# Patient Record
Sex: Male | Born: 1950 | Race: White | Hispanic: No | Smoking: Never smoker
Health system: Southern US, Community
[De-identification: ages and names within clinical notes are randomized; demographics above are authoritative.]

## PROBLEM LIST (undated history)

## (undated) DIAGNOSIS — K5790 Diverticulosis of intestine, part unspecified, without perforation or abscess without bleeding: Secondary | ICD-10-CM

## (undated) DIAGNOSIS — E119 Type 2 diabetes mellitus without complications: Secondary | ICD-10-CM

## (undated) HISTORY — DX: Diverticulosis of intestine, part unspecified, without perforation or abscess without bleeding: K57.90

## (undated) HISTORY — DX: Type 2 diabetes mellitus without complications: E11.9

## (undated) HISTORY — PX: WISDOM TOOTH EXTRACTION: SHX21

---

## 2007-02-23 HISTORY — PX: OTHER SURGICAL HISTORY: SHX169

## 2007-07-21 ENCOUNTER — Encounter: Admission: RE | Admit: 2007-07-21 | Discharge: 2007-07-21 | Payer: Self-pay | Admitting: Family Medicine

## 2008-08-12 ENCOUNTER — Ambulatory Visit: Payer: Self-pay | Admitting: Internal Medicine

## 2008-08-15 ENCOUNTER — Encounter: Admission: RE | Admit: 2008-08-15 | Discharge: 2008-08-15 | Payer: Self-pay | Admitting: Internal Medicine

## 2008-08-20 ENCOUNTER — Encounter: Admission: RE | Admit: 2008-08-20 | Discharge: 2008-08-20 | Payer: Self-pay | Admitting: Internal Medicine

## 2008-09-09 ENCOUNTER — Ambulatory Visit: Payer: Self-pay | Admitting: Internal Medicine

## 2008-12-10 ENCOUNTER — Ambulatory Visit: Payer: Self-pay | Admitting: Internal Medicine

## 2009-01-06 ENCOUNTER — Ambulatory Visit: Payer: Self-pay | Admitting: Internal Medicine

## 2009-08-19 ENCOUNTER — Ambulatory Visit: Payer: Self-pay | Admitting: Internal Medicine

## 2010-01-05 ENCOUNTER — Ambulatory Visit: Payer: Self-pay | Admitting: Internal Medicine

## 2010-03-15 ENCOUNTER — Encounter: Payer: Self-pay | Admitting: Family Medicine

## 2010-08-20 ENCOUNTER — Other Ambulatory Visit: Payer: BC Managed Care – PPO | Admitting: Internal Medicine

## 2010-08-20 DIAGNOSIS — E119 Type 2 diabetes mellitus without complications: Secondary | ICD-10-CM

## 2010-08-20 DIAGNOSIS — Z0000 Encounter for general adult medical examination without abnormal findings: Secondary | ICD-10-CM

## 2010-08-20 LAB — COMPREHENSIVE METABOLIC PANEL
AST: 27 U/L (ref 0–37)
Albumin: 4.6 g/dL (ref 3.5–5.2)
Alkaline Phosphatase: 64 U/L (ref 39–117)
BUN: 19 mg/dL (ref 6–23)
Creat: 0.94 mg/dL (ref 0.50–1.35)
Glucose, Bld: 139 mg/dL — ABNORMAL HIGH (ref 70–99)
Potassium: 4.5 mEq/L (ref 3.5–5.3)
Total Bilirubin: 0.6 mg/dL (ref 0.3–1.2)

## 2010-08-20 LAB — LIPID PANEL
Cholesterol: 179 mg/dL (ref 0–200)
HDL: 35 mg/dL — ABNORMAL LOW (ref >39)
LDL Cholesterol: 109 mg/dL — ABNORMAL HIGH (ref 0–99)
Total CHOL/HDL Ratio: 5.1 Ratio
Triglycerides: 175 mg/dL — ABNORMAL HIGH (ref <150)
VLDL: 35 mg/dL (ref 0–40)

## 2010-08-21 ENCOUNTER — Encounter: Payer: Self-pay | Admitting: Internal Medicine

## 2010-08-21 ENCOUNTER — Ambulatory Visit (INDEPENDENT_AMBULATORY_CARE_PROVIDER_SITE_OTHER): Payer: BC Managed Care – PPO | Admitting: Internal Medicine

## 2010-08-21 VITALS — BP 136/76 | HR 68 | Temp 98.8°F | Ht 71.0 in | Wt 235.0 lb

## 2010-08-21 DIAGNOSIS — Z23 Encounter for immunization: Secondary | ICD-10-CM

## 2010-08-21 DIAGNOSIS — E781 Pure hyperglyceridemia: Secondary | ICD-10-CM

## 2010-08-21 DIAGNOSIS — E669 Obesity, unspecified: Secondary | ICD-10-CM

## 2010-08-21 DIAGNOSIS — E119 Type 2 diabetes mellitus without complications: Secondary | ICD-10-CM

## 2010-08-21 DIAGNOSIS — Z0000 Encounter for general adult medical examination without abnormal findings: Secondary | ICD-10-CM

## 2010-08-21 LAB — CBC WITH DIFFERENTIAL/PLATELET
Hemoglobin: 15.3 g/dL (ref 13.0–17.0)
Lymphs Abs: 2.7 10*3/uL (ref 0.7–4.0)
Monocytes Relative: 9 % (ref 3–12)
Neutro Abs: 4 10*3/uL (ref 1.7–7.7)
Neutrophils Relative %: 53 % (ref 43–77)
RBC: 5.27 MIL/uL (ref 4.22–5.81)
WBC: 7.7 10*3/uL (ref 4.0–10.5)

## 2010-08-21 LAB — POCT URINALYSIS DIPSTICK
Bilirubin, UA: NEGATIVE
Ketones, UA: NEGATIVE
Leukocytes, UA: NEGATIVE
Nitrite, UA: NEGATIVE
Protein, UA: NEGATIVE

## 2010-08-21 LAB — PSA, TOTAL AND FREE: PSA, Free Pct: 50 % (ref >25)

## 2010-08-21 LAB — HEMOGLOBIN A1C: Mean Plasma Glucose: 143 mg/dL — ABNORMAL HIGH (ref <117)

## 2010-08-21 MED ORDER — ZOSTER VACCINE LIVE 19400 UNT/0.65ML ~~LOC~~ SOLR
0.6500 mL | Freq: Once | SUBCUTANEOUS | Status: AC
  Administered 2010-08-21: 19400 [IU] via SUBCUTANEOUS

## 2010-08-22 ENCOUNTER — Encounter: Payer: Self-pay | Admitting: Internal Medicine

## 2010-08-22 DIAGNOSIS — E669 Obesity, unspecified: Secondary | ICD-10-CM | POA: Insufficient documentation

## 2010-08-22 DIAGNOSIS — E119 Type 2 diabetes mellitus without complications: Secondary | ICD-10-CM | POA: Insufficient documentation

## 2010-08-22 NOTE — Patient Instructions (Signed)
Recommend diet exercise and weight loss 10-15 pounds minimum in the next 6 months. Please take Lipitor as prescribed at supper for elevated triglycerides with history of diabetes and family history of heart disease. Please start taking a baby aspirin 81 mg daily in view of family history of heart disease and diabetes. Please take metformin 500 mg twice daily not once daily. Would suggest that you check Accu-Cheks before meals on occasion to monitor your blood sugar. Ideal blood sugar would be a proximally 110.

## 2010-08-22 NOTE — Progress Notes (Signed)
Subjective:    Patient ID: Steven Wheeler, male    DOB: 1950/11/23, 60 y.o.   MRN: [id number]  HPI  patient in today for health maintenance exam and evaluation of medical problems including adult onset diabetes and hyperlipidemia. He's been a patient here since 2010. No known drug allergies. History of fractured left arm at age 89, history of hepatitis A approximately 51, wisdom teeth removed approximately age 32. Had virtual colonoscopy May 2009. Annual eye exam by Dr. Sherryle Lis each fall- last in fall 2011. Controls his diabetes with diet exercise and metformin. However is only been taking metformin 500 mg once daily instead of twice daily as I originally prescribed. Initial weight when we first started seeing him was 234 pounds. At one point he lost 10 pounds in the summer of 2010. he had but has since regained it. Last hemoglobin A1c in November 2011 6.4% in June 2011 triglycerides elevated at 194 but normal cholesterol and LDL cholesterol. In June 2011 hemoglobin A1c 6.3% in November 2010, hemoglobin A1c 6.5%. Best A1c ever was 6.2% in July 2010 which coincided with his weight loss of 10 pounds. Nonsmoker. Social alcohol consumption only. Married. Wife is a Runner, broadcasting/film/video. 2 adult daughters. He is a Medical illustrator.    Review of Systems  Constitutional: Negative.   HENT: Negative.   Eyes: Negative.   Respiratory: Negative.   Cardiovascular: Negative.   Gastrointestinal: Negative.   Genitourinary: Negative for frequency, hematuria, decreased urine volume, discharge, penile swelling, scrotal swelling, penile pain and testicular pain.  Musculoskeletal: Negative.   Neurological: Negative.   Hematological: Negative.   Psychiatric/Behavioral: Negative.        Objective:   Physical Exam  Constitutional: He is oriented to person, place, and time. He appears well-nourished. No distress.  HENT:  Head: Normocephalic and atraumatic.  Nose: Nose normal.  Mouth/Throat: Oropharynx is clear and moist. No  oropharyngeal exudate.  Eyes: Conjunctivae and EOM are normal. Pupils are equal, round, and reactive to light. Right eye exhibits no discharge. Left eye exhibits no discharge. No scleral icterus.  Neck: Normal range of motion. Neck supple. No JVD present. No thyromegaly present.  Cardiovascular: Normal rate, regular rhythm and normal heart sounds.   No murmur heard. Pulmonary/Chest: Effort normal and breath sounds normal. No respiratory distress. He has no wheezes. He has no rales. He exhibits no tenderness.  Abdominal: Soft. Bowel sounds are normal. He exhibits no distension and no mass. There is no tenderness. There is no rebound.  Genitourinary: Prostate normal and penis normal.  Musculoskeletal: Normal range of motion. He exhibits no edema.  Lymphadenopathy:    He has no cervical adenopathy.  Neurological: He is alert and oriented to person, place, and time. He has normal reflexes. No cranial nerve deficit. Coordination normal.  Skin: Skin is warm and dry. No rash noted. He is not diaphoretic.  Psychiatric: He has a normal mood and affect. His behavior is normal. Judgment normal.          Assessment & Plan:  1-adult onset diabetes mellitus controlled with metformin diet and exercise. Patient has only been taking metformin once daily. Increase to twice daily as previously prescribed and recheck in 6 months.  2-hypertriglyceridemia. Explained to patient that [facility] had wanted him to be on a statin medication. He is a bit reluctant to do that but I think with history of diabetes in father who died with heart attack it would be good to put him on low dose Lipitor 10  mg daily and recheck lipid panel and liver functions in 6 months. He is agreeable to this.  3-health maintenance--- would suggest he start a baby aspirin 81 mg daily in view of family history diabetes and hypertriglyceridemia. Zostavax vaccine administered today. Return in 6 months. Patient not checking Accu-Cheks  on a regular basis. Would advise that he try to do this diet exercise and lose 10-15 pounds minimal

## 2010-10-20 ENCOUNTER — Ambulatory Visit (INDEPENDENT_AMBULATORY_CARE_PROVIDER_SITE_OTHER): Payer: BC Managed Care – PPO | Admitting: Internal Medicine

## 2010-10-20 ENCOUNTER — Encounter: Payer: Self-pay | Admitting: Internal Medicine

## 2010-10-20 VITALS — BP 140/82 | HR 72 | Temp 97.3°F | Ht 71.0 in | Wt 232.0 lb

## 2010-10-20 DIAGNOSIS — L259 Unspecified contact dermatitis, unspecified cause: Secondary | ICD-10-CM

## 2010-10-21 ENCOUNTER — Encounter: Payer: Self-pay | Admitting: Internal Medicine

## 2010-10-21 NOTE — Progress Notes (Signed)
  Subjective:    Patient ID: Steven Wheeler, male    DOB: 1950/07/21, 60 y.o.   MRN: [id number]  HPI  patient and his brother were burning some brush on August 25 and August 26. Patient wore goggles and a long sleeve shirt but still apparently came in contact with poison ivy and has periorbital swelling and itching.    Review of Systems     Objective:   Physical Exam erythema and periorbital swelling both eyes. No evidence of conjunctivitis.        Assessment & Plan:  Contact dermatitis which will be treated with  Sterapred DS 10 mg 6 day dosepak take as directed in a tapering course

## 2010-11-11 ENCOUNTER — Other Ambulatory Visit: Payer: Self-pay | Admitting: Internal Medicine

## 2010-11-11 ENCOUNTER — Other Ambulatory Visit: Payer: Self-pay

## 2010-11-11 MED ORDER — METFORMIN HCL 500 MG PO TABS: 500.0000 mg | ORAL_TABLET | Freq: Two times a day (BID) | ORAL | Status: DC

## 2011-02-08 ENCOUNTER — Other Ambulatory Visit: Payer: BC Managed Care – PPO | Admitting: Internal Medicine

## 2011-02-08 DIAGNOSIS — E785 Hyperlipidemia, unspecified: Secondary | ICD-10-CM

## 2011-02-08 LAB — LIPID PANEL
Cholesterol: 143 mg/dL (ref 0–200)
Total CHOL/HDL Ratio: 4.2 Ratio

## 2011-02-09 ENCOUNTER — Encounter: Payer: Self-pay | Admitting: Internal Medicine

## 2011-02-09 ENCOUNTER — Ambulatory Visit (INDEPENDENT_AMBULATORY_CARE_PROVIDER_SITE_OTHER): Payer: BC Managed Care – PPO | Admitting: Internal Medicine

## 2011-02-09 DIAGNOSIS — I10 Essential (primary) hypertension: Secondary | ICD-10-CM

## 2011-02-09 DIAGNOSIS — E119 Type 2 diabetes mellitus without complications: Secondary | ICD-10-CM

## 2011-02-09 DIAGNOSIS — E781 Pure hyperglyceridemia: Secondary | ICD-10-CM

## 2011-02-09 LAB — HEMOGLOBIN A1C: Mean Plasma Glucose: 157 mg/dL — ABNORMAL HIGH (ref <117)

## 2011-02-22 ENCOUNTER — Encounter: Payer: Self-pay | Admitting: Internal Medicine

## 2011-02-22 NOTE — Progress Notes (Signed)
  Subjective:    Patient ID: Steven Wheeler, male    DOB: [birth year], 60 y.o.   MRN: [id number]  HPI 60 year old white male with history of diabetes mellitus diagnosed in 2010 treated with metformin. He takes baby aspirin 81 mg daily. Was tried on Lipitor for short time but he was not compliant with it. History of hypertriglyceridemia. Patient is married. His wife is a Runner, broadcasting/film/video. 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 History wisdom teeth extraction x4 around age 33, history of hepatitis A in 1982. Fractured left arm at age 89. Had a virtual colonoscopy 2009. Dr. Sherryle Lis does yearly eye exams. Had shingles vaccine June 2006, Pneumovax immunization October 2010, tetanus immunization 2004.    Review of Systems     Objective:   Physical Exam his blood pressure is elevated today and has been elevated the last couple of visits he's had. He doesn't like to acknowledge that he has hypertension. I tried to start him on an ACE inhibitor a while back and he didn't really want to do that. Chest is clear, neck no bruits no JVD; cardiac exam regular rate and rhythm; extremities without edema; diabetic foot exam: no ulcers, pulses are normal        Assessment & Plan:  Adult onset diabetes  Hypertriglyceridemia  Hypertension  Plan: Add ACE inhibitor. Try to get patient to take statin medication once again. He prefers to control lipids by diet. Return in a couple of months for blood pressure followup.

## 2011-02-22 NOTE — Patient Instructions (Signed)
Please began to take ACE inhibitor as directed daily. Consider restarting statin medication Cardura diabetic and you have hypertriglyceridemia. Return in 2 months. Continue metformin.

## 2011-04-16 ENCOUNTER — Other Ambulatory Visit: Payer: Self-pay | Admitting: Internal Medicine

## 2011-06-24 ENCOUNTER — Ambulatory Visit (INDEPENDENT_AMBULATORY_CARE_PROVIDER_SITE_OTHER): Payer: BC Managed Care – PPO | Admitting: Internal Medicine

## 2011-06-24 ENCOUNTER — Encounter: Payer: Self-pay | Admitting: Internal Medicine

## 2011-06-24 VITALS — BP 144/80 | HR 80 | Temp 98.6°F | Wt 244.0 lb

## 2011-06-24 DIAGNOSIS — J329 Chronic sinusitis, unspecified: Secondary | ICD-10-CM

## 2011-06-24 DIAGNOSIS — J309 Allergic rhinitis, unspecified: Secondary | ICD-10-CM

## 2011-06-24 NOTE — Progress Notes (Signed)
  Subjective:    Patient ID: Steven Wheeler, male    DOB: 11-07-1950, 61 y.o.   MRN: [id number]  HPI Patient has been ill with respiratory infection for several weeks. Wife has had similar illness and was recently seen here  with bronchitis. Patient says he has discolored nasal drainage, no sore throat, no fever or chills. Says he has nasal congestion and malaise.    Review of Systems     Objective:   Physical Exam   TMs are slightly full but not red; neck supple without significant adenopathy; pharynx only very slightly injected without exudate. Boggy nasal mucosa. Chest clear to auscultation.           Sinusitis  Possible allergic rhinitis  Plan: Augmentin 500 mg 3 times daily for 10 days. Medrol Dosepak 4 mg 6 day

## 2011-06-24 NOTE — Patient Instructions (Addendum)
Take antibiotics and steroids as prescribed. Call if not better in 2 weeks.

## 2011-08-23 ENCOUNTER — Other Ambulatory Visit: Payer: BC Managed Care – PPO | Admitting: Internal Medicine

## 2011-08-23 DIAGNOSIS — E119 Type 2 diabetes mellitus without complications: Secondary | ICD-10-CM

## 2011-08-23 DIAGNOSIS — Z125 Encounter for screening for malignant neoplasm of prostate: Secondary | ICD-10-CM

## 2011-08-23 DIAGNOSIS — I10 Essential (primary) hypertension: Secondary | ICD-10-CM

## 2011-08-23 DIAGNOSIS — E785 Hyperlipidemia, unspecified: Secondary | ICD-10-CM

## 2011-08-23 LAB — CBC WITH DIFFERENTIAL/PLATELET
HCT: 43.7 % (ref 39.0–52.0)
Hemoglobin: 15.4 g/dL (ref 13.0–17.0)
Lymphs Abs: 2.5 10*3/uL (ref 0.7–4.0)
MCH: 29.8 pg (ref 26.0–34.0)
Monocytes Absolute: 0.8 10*3/uL (ref 0.1–1.0)
Monocytes Relative: 10 % (ref 3–12)
Neutro Abs: 3.9 10*3/uL (ref 1.7–7.7)
Neutrophils Relative %: 52 % (ref 43–77)
RBC: 5.17 MIL/uL (ref 4.22–5.81)

## 2011-08-23 LAB — COMPREHENSIVE METABOLIC PANEL
Albumin: 4.6 g/dL (ref 3.5–5.2)
CO2: 25 mEq/L (ref 19–32)
Calcium: 9.7 mg/dL (ref 8.4–10.5)
Chloride: 102 mEq/L (ref 96–112)
Glucose, Bld: 179 mg/dL — ABNORMAL HIGH (ref 70–99)
Potassium: 4.7 mEq/L (ref 3.5–5.3)
Sodium: 138 mEq/L (ref 135–145)
Total Protein: 7 g/dL (ref 6.0–8.3)

## 2011-08-23 LAB — HEMOGLOBIN A1C: Mean Plasma Glucose: 160 mg/dL — ABNORMAL HIGH (ref <117)

## 2011-08-23 LAB — LIPID PANEL
Cholesterol: 177 mg/dL (ref 0–200)
LDL Cholesterol: 117 mg/dL — ABNORMAL HIGH (ref 0–99)
Triglycerides: 126 mg/dL (ref <150)

## 2011-08-24 ENCOUNTER — Ambulatory Visit (INDEPENDENT_AMBULATORY_CARE_PROVIDER_SITE_OTHER): Payer: BC Managed Care – PPO | Admitting: Internal Medicine

## 2011-08-24 ENCOUNTER — Encounter: Payer: Self-pay | Admitting: Internal Medicine

## 2011-08-24 VITALS — BP 140/92 | HR 68 | Temp 97.8°F | Ht 71.0 in | Wt 239.0 lb

## 2011-08-24 DIAGNOSIS — E119 Type 2 diabetes mellitus without complications: Secondary | ICD-10-CM

## 2011-08-24 DIAGNOSIS — Z0000 Encounter for general adult medical examination without abnormal findings: Secondary | ICD-10-CM

## 2011-08-24 DIAGNOSIS — E785 Hyperlipidemia, unspecified: Secondary | ICD-10-CM

## 2011-08-24 DIAGNOSIS — N529 Male erectile dysfunction, unspecified: Secondary | ICD-10-CM

## 2011-08-24 LAB — POCT URINALYSIS DIPSTICK
Bilirubin, UA: NEGATIVE
Glucose, UA: NEGATIVE
Leukocytes, UA: NEGATIVE
Nitrite, UA: NEGATIVE
pH, UA: 6

## 2011-09-18 DIAGNOSIS — N529 Male erectile dysfunction, unspecified: Secondary | ICD-10-CM | POA: Insufficient documentation

## 2011-09-18 NOTE — Patient Instructions (Addendum)
Try the diet exercise and lose some weight. Watch Accu-Cheks on a more regular basis. Return in 6 months for office visit lipid panel and hemoglobin A1c

## 2011-09-18 NOTE — Progress Notes (Signed)
  Subjective:    Patient ID: Steven Wheeler, male    DOB: [birth year], 61 y.o.   MRN: [id number]  HPI 61 year old white male with history of adult onset diabetes and hyperlipidemia. No known drug allergies.  Past medical history: Fractured left arm at age 48, history of Hepatitis A approximately 10, wisdom teeth removed at approximately age 82. Had virtual colonoscopy may 2009. Has annual eye exam by Dr. Sherryle Lis each fall of the year.  Controls his diabetes with diet and exercise and metformin. Nonsmoker. Social alcohol consumption only.  Social history: He is married wife is a Runner, broadcasting/film/video. 2 adult daughters. He is a Medical illustrator.    Review of Systems  Constitutional: Negative.   HENT: Negative.   Eyes: Negative.   Respiratory: Negative.   Cardiovascular: Negative.   Gastrointestinal: Negative.   Genitourinary: Negative.   Musculoskeletal: Negative.   Neurological: Negative.   Hematological: Negative.   Psychiatric/Behavioral: Negative.        Objective:   Physical Exam  Vitals reviewed. Constitutional: He is oriented to person, place, and time. He appears well-developed and well-nourished. No distress.  HENT:  Head: Normocephalic and atraumatic.  Right Ear: External ear normal.  Left Ear: External ear normal.  Mouth/Throat: Oropharynx is clear and moist. No oropharyngeal exudate.  Eyes: Conjunctivae and EOM are normal. Pupils are equal, round, and reactive to light. Right eye exhibits no discharge. No scleral icterus.  Neck: Neck supple. No JVD present. No thyromegaly present.  Cardiovascular: Normal rate, regular rhythm, normal heart sounds and intact distal pulses.   No murmur heard. Pulmonary/Chest: Effort normal and breath sounds normal. He has no wheezes. He has no rales.  Abdominal: Soft. Bowel sounds are normal. He exhibits no distension and no mass. There is no tenderness. There is no rebound and no guarding.  Genitourinary: Prostate normal.  Musculoskeletal: Normal range  of motion. He exhibits no edema and no tenderness.  Lymphadenopathy:    He has no cervical adenopathy.  Neurological: He is alert and oriented to person, place, and time. He has normal reflexes. No cranial nerve deficit. Coordination normal.  Skin: Skin is warm and dry. He is not diaphoretic.  Psychiatric: He has a normal mood and affect. His behavior is normal. Thought content normal.          Assessment & Plan:  Type 2 diabetes mellitus control with metformin  Erectile dysfunction  Hyperlipidemia  Remote history of hepatitis A  Plan: Return in 6 months for office visit, lipid panel and hemoglobin A1c. Encouraged diet and exercise. He does not want to be a lipid-lowering medication despite being a diabetic. Hemoglobin A1c 7.2%. Reevaluate in 6 months. Needs to lose some weight. Renew Viagra for one year.

## 2011-12-22 ENCOUNTER — Other Ambulatory Visit: Payer: Self-pay | Admitting: Internal Medicine

## 2012-02-28 ENCOUNTER — Other Ambulatory Visit: Payer: BC Managed Care – PPO | Admitting: Internal Medicine

## 2012-02-28 DIAGNOSIS — E119 Type 2 diabetes mellitus without complications: Secondary | ICD-10-CM

## 2012-02-28 DIAGNOSIS — E785 Hyperlipidemia, unspecified: Secondary | ICD-10-CM

## 2012-02-28 LAB — LIPID PANEL
LDL Cholesterol: 102 mg/dL — ABNORMAL HIGH (ref 0–99)
Triglycerides: 184 mg/dL — ABNORMAL HIGH (ref <150)

## 2012-02-29 ENCOUNTER — Ambulatory Visit (INDEPENDENT_AMBULATORY_CARE_PROVIDER_SITE_OTHER): Payer: BC Managed Care – PPO | Admitting: Internal Medicine

## 2012-02-29 ENCOUNTER — Encounter: Payer: Self-pay | Admitting: Internal Medicine

## 2012-02-29 VITALS — BP 138/76 | HR 76 | Temp 98.2°F | Wt 250.0 lb

## 2012-02-29 DIAGNOSIS — E119 Type 2 diabetes mellitus without complications: Secondary | ICD-10-CM

## 2012-02-29 DIAGNOSIS — E785 Hyperlipidemia, unspecified: Secondary | ICD-10-CM

## 2012-02-29 DIAGNOSIS — E669 Obesity, unspecified: Secondary | ICD-10-CM

## 2012-04-30 ENCOUNTER — Encounter: Payer: Self-pay | Admitting: Internal Medicine

## 2012-04-30 NOTE — Patient Instructions (Addendum)
Continue metformin. Consider statin medication. Return in 6 months. Lose weight and exercise.

## 2012-04-30 NOTE — Progress Notes (Signed)
  Subjective:    Patient ID: Steven Wheeler, male    DOB: 1950/09/09, 62 y.o.   MRN: [id number]  HPI 62 year old white male last seen June 2012 for health maintenance and evaluation of medical problems including adult onset diabetes and hyperlipidemia. He has a history of hypertriglyceridemia, type 2 diabetes mellitus and obesity. Controls his diabetes with diet, exercise, and metformin. Hemoglobin A1c in November 2011 6.4%. History of elevated triglycerides at 194 in June 2011 with normal cholesterol and LDL cholesterol. He is a nonsmoker. Social alcohol consumption. 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 Wife is a Runner, broadcasting/film/video. He plans to retire after the first of the year. She will be retiring as well.  He is here for six-month recheck. Weight is up 15 pounds from June 2012.    Review of Systems     Objective:   Physical Exam skin warm and dry, nodes none. HEENT exam: TMs and pharynx are clear. Neck is supple without thyromegaly JVD or carotid bruits. Chest clear to auscultation. Cardiac exam regular rate and rhythm normal S1 and S2. Extremities without edema. Pulses in feet are normal. Diabetic foot exam: No calluses or alters.        Assessment & Plan:  Type 2 diabetes mellitus-well controlled without complications. Hemoglobin A1c 7.1% and previously was 6.6% June 2012 Hypertriglyceridemia-not improved   Hyper cholesterolemia-LDL cholesterol improved from 117-102  Obesity-increase of 15 pounds since June 2012  Plan: Encouraged diet and exercise. Patient does not want to be on statin medication. Continue metformin for diabetic control. Return in 6 months for physical examination. Says with retirement he'll have time for diet and exercise.  Time spent with patient 25 minutes

## 2012-08-31 ENCOUNTER — Other Ambulatory Visit: Payer: Self-pay | Admitting: Internal Medicine

## 2012-08-31 ENCOUNTER — Other Ambulatory Visit: Payer: BC Managed Care – PPO | Admitting: Internal Medicine

## 2012-08-31 DIAGNOSIS — Z130 Encounter for screening for diseases of the blood and blood-forming organs and certain disorders involving the immune mechanism: Secondary | ICD-10-CM

## 2012-08-31 DIAGNOSIS — I10 Essential (primary) hypertension: Secondary | ICD-10-CM

## 2012-08-31 DIAGNOSIS — E785 Hyperlipidemia, unspecified: Secondary | ICD-10-CM

## 2012-08-31 DIAGNOSIS — E119 Type 2 diabetes mellitus without complications: Secondary | ICD-10-CM

## 2012-08-31 DIAGNOSIS — Z125 Encounter for screening for malignant neoplasm of prostate: Secondary | ICD-10-CM

## 2012-08-31 LAB — CBC WITH DIFFERENTIAL/PLATELET
Basophils Absolute: 0.1 10*3/uL (ref 0.0–0.1)
Basophils Relative: 1 % (ref 0–1)
Eosinophils Absolute: 0.2 10*3/uL (ref 0.0–0.7)
Hemoglobin: 15 g/dL (ref 13.0–17.0)
MCH: 28.8 pg (ref 26.0–34.0)
MCHC: 34 g/dL (ref 30.0–36.0)
Monocytes Absolute: 0.7 10*3/uL (ref 0.1–1.0)
Monocytes Relative: 10 % (ref 3–12)
Neutro Abs: 4.1 10*3/uL (ref 1.7–7.7)
Neutrophils Relative %: 52 % (ref 43–77)
RDW: 13.1 % (ref 11.5–15.5)

## 2012-08-31 LAB — COMPREHENSIVE METABOLIC PANEL WITH GFR
ALT: 33 U/L (ref 0–53)
AST: 39 U/L — ABNORMAL HIGH (ref 0–37)
Albumin: 4.1 g/dL (ref 3.5–5.2)
Alkaline Phosphatase: 56 U/L (ref 39–117)
BUN: 22 mg/dL (ref 6–23)
CO2: 24 meq/L (ref 19–32)
Calcium: 9.1 mg/dL (ref 8.4–10.5)
Chloride: 101 meq/L (ref 96–112)
Creat: 1.03 mg/dL (ref 0.50–1.35)
Glucose, Bld: 150 mg/dL — ABNORMAL HIGH (ref 70–99)
Potassium: 4.2 meq/L (ref 3.5–5.3)
Sodium: 135 meq/L (ref 135–145)
Total Bilirubin: 0.7 mg/dL (ref 0.3–1.2)
Total Protein: 6.7 g/dL (ref 6.0–8.3)

## 2012-08-31 LAB — LIPID PANEL
Cholesterol: 145 mg/dL (ref 0–200)
HDL: 37 mg/dL — ABNORMAL LOW
LDL Cholesterol: 85 mg/dL (ref 0–99)
Total CHOL/HDL Ratio: 3.9 ratio
Triglycerides: 117 mg/dL
VLDL: 23 mg/dL (ref 0–40)

## 2012-08-31 LAB — HEMOGLOBIN A1C: Hgb A1c MFr Bld: 6.9 % — ABNORMAL HIGH (ref <5.7)

## 2012-09-01 ENCOUNTER — Encounter: Payer: Self-pay | Admitting: Internal Medicine

## 2012-09-01 ENCOUNTER — Ambulatory Visit (INDEPENDENT_AMBULATORY_CARE_PROVIDER_SITE_OTHER): Payer: BC Managed Care – PPO | Admitting: Internal Medicine

## 2012-09-01 ENCOUNTER — Other Ambulatory Visit: Payer: Self-pay | Admitting: Internal Medicine

## 2012-09-01 VITALS — BP 140/84 | HR 72 | Temp 97.9°F | Ht 72.0 in | Wt 226.0 lb

## 2012-09-01 DIAGNOSIS — E785 Hyperlipidemia, unspecified: Secondary | ICD-10-CM

## 2012-09-01 DIAGNOSIS — Z23 Encounter for immunization: Secondary | ICD-10-CM

## 2012-09-01 DIAGNOSIS — N529 Male erectile dysfunction, unspecified: Secondary | ICD-10-CM

## 2012-09-01 DIAGNOSIS — E119 Type 2 diabetes mellitus without complications: Secondary | ICD-10-CM

## 2012-09-01 DIAGNOSIS — Z0000 Encounter for general adult medical examination without abnormal findings: Secondary | ICD-10-CM

## 2012-09-01 LAB — POCT URINALYSIS DIPSTICK
Bilirubin, UA: NEGATIVE
Blood, UA: NEGATIVE
Ketones, UA: NEGATIVE
Leukocytes, UA: NEGATIVE
Spec Grav, UA: 1.01
pH, UA: 7

## 2012-09-01 MED ORDER — TETANUS-DIPHTH-ACELL PERTUSSIS 5-2.5-18.5 LF-MCG/0.5 IM SUSP: 0.5000 mL | Freq: Once | INTRAMUSCULAR | Status: DC

## 2012-09-01 NOTE — Patient Instructions (Addendum)
Home blood pressure monitor prescribed. Call with results in 2 weeks. Increase metformin to twice daily. Return in 6 months for six-month recheck, office visit lipid panel hemoglobin A1c. Patient does not want to be on statin medication.

## 2012-09-01 NOTE — Progress Notes (Signed)
  Subjective:    Patient ID: Steven Wheeler, male    DOB: 07-04-1950, 62 y.o.   MRN: [id number]  HPI 62 year old White male for health maintenance and evaluation of medical problems. History of DM and hyperlipidemia. History of erectile dysfunction treated with Viagra.  No known drug allergies  Past medical history: History of fractured left arm at age 38, history of hepatitis a around 74, wisdom teeth removed at approximately age 28.   Has annual exam by Dr. Sherryle Lis each Fall  Virtual colonoscopy may 2009.  Controls diabetes with diet exercise and metformin. Has only been taking metformin once daily instead of twice daily. I would like for him to take it twice daily. He has retired and has more time for diet and exercise now.    Social history: Nonsmoker, social alcohol consumption. Married. Wife is a retired Runner, broadcasting/film/video. 2 adult daughters. Formerly worked as a Medical illustrator at [employer]    Review of Systems  Constitutional: Negative.   HENT: Negative.   Eyes: Negative.   Respiratory: Negative.   Cardiovascular: Negative.   Gastrointestinal: Negative.   Genitourinary:       History of erectile dysfunction  Allergic/Immunologic: Negative.   Neurological: Negative.   Hematological: Negative.   Psychiatric/Behavioral: Negative.        Objective:   Physical Exam  Vitals reviewed. Constitutional: He is oriented to person, place, and time. He appears well-developed and well-nourished. No distress.  HENT:  Head: Normocephalic and atraumatic.  Right Ear: External ear normal.  Left Ear: External ear normal.  Mouth/Throat: Oropharynx is clear and moist. No oropharyngeal exudate.  Eyes: Conjunctivae and EOM are normal. Pupils are equal, round, and reactive to light.  Neck: Neck supple. No JVD present. No thyromegaly present.  Cardiovascular: Normal rate, regular rhythm, normal heart sounds and intact distal pulses.   No murmur heard. Pulmonary/Chest: Effort normal and breath  sounds normal. No respiratory distress. He has no wheezes. He has no rales. He exhibits no tenderness.  Abdominal: Soft. Bowel sounds are normal. He exhibits no distension and no mass. There is no tenderness. There is no rebound and no guarding.  Genitourinary: Prostate normal.  Lymphadenopathy:    He has no cervical adenopathy.  Neurological: He is alert and oriented to person, place, and time. He has normal reflexes. No cranial nerve deficit. Coordination normal.  Skin: Skin is warm and dry. No rash noted. He is not diaphoretic.  Psychiatric: He has a normal mood and affect. His behavior is normal. Judgment and thought content normal.          Assessment & Plan:  Controlled type 2 diabetes. Needs to take metformin twice daily instead of once daily. Hemoglobin A1c is 6.9% and previously was 7.1%. Increasing metformin to twice daily would likely improve this.  Elevated blood pressure-needs to purchase home blood pressure monitor and provide readings daily for the next 2 weeks. He may need to be on antihypertensive medication.  Hyperlipidemia-stable with diet. Despite being a diabetic, he does not want to be on statin medication. Have made this clear to insurance company. His lipid panel is normal.  Erectile dysfunction-treated with Viagra. Refill for one year.  Plan: Provide blood pressure readings in 2 weeks with home blood pressure monitor. Otherwise return in 6 months for office visit lipid panel and hemoglobin A1c. Take metformin 500 mg twice daily.

## 2012-09-02 ENCOUNTER — Encounter: Payer: Self-pay | Admitting: Internal Medicine

## 2012-09-04 NOTE — Progress Notes (Signed)
Left message to call.

## 2012-09-05 NOTE — Progress Notes (Signed)
Patient informed. 

## 2012-12-06 ENCOUNTER — Ambulatory Visit (INDEPENDENT_AMBULATORY_CARE_PROVIDER_SITE_OTHER): Payer: BC Managed Care – PPO | Admitting: Internal Medicine

## 2012-12-06 DIAGNOSIS — Z23 Encounter for immunization: Secondary | ICD-10-CM

## 2013-01-16 ENCOUNTER — Other Ambulatory Visit: Payer: Self-pay | Admitting: Internal Medicine

## 2013-03-08 ENCOUNTER — Other Ambulatory Visit: Payer: BC Managed Care – PPO | Admitting: Internal Medicine

## 2013-03-08 DIAGNOSIS — E119 Type 2 diabetes mellitus without complications: Secondary | ICD-10-CM

## 2013-03-08 DIAGNOSIS — E785 Hyperlipidemia, unspecified: Secondary | ICD-10-CM

## 2013-03-09 ENCOUNTER — Ambulatory Visit (INDEPENDENT_AMBULATORY_CARE_PROVIDER_SITE_OTHER): Payer: BC Managed Care – PPO | Admitting: Internal Medicine

## 2013-03-09 ENCOUNTER — Encounter: Payer: Self-pay | Admitting: Internal Medicine

## 2013-03-09 VITALS — BP 148/84 | HR 84 | Temp 98.0°F | Wt 226.0 lb

## 2013-03-09 DIAGNOSIS — E785 Hyperlipidemia, unspecified: Secondary | ICD-10-CM

## 2013-03-09 DIAGNOSIS — E119 Type 2 diabetes mellitus without complications: Secondary | ICD-10-CM

## 2013-03-09 DIAGNOSIS — R030 Elevated blood-pressure reading, without diagnosis of hypertension: Secondary | ICD-10-CM

## 2013-03-09 DIAGNOSIS — IMO0001 Reserved for inherently not codable concepts without codable children: Secondary | ICD-10-CM

## 2013-03-09 LAB — LIPID PANEL
CHOLESTEROL: 165 mg/dL (ref 0–200)
HDL: 37 mg/dL — ABNORMAL LOW (ref >39)
LDL Cholesterol: 94 mg/dL (ref 0–99)
TRIGLYCERIDES: 171 mg/dL — AB (ref <150)
Total CHOL/HDL Ratio: 4.5 Ratio
VLDL: 34 mg/dL (ref 0–40)

## 2013-03-09 LAB — HEMOGLOBIN A1C
Hgb A1c MFr Bld: 7.2 % — ABNORMAL HIGH (ref <5.7)
Mean Plasma Glucose: 160 mg/dL — ABNORMAL HIGH (ref <117)

## 2013-03-09 NOTE — Progress Notes (Signed)
   Subjective:    Patient ID: Steven Wheeler, male    DOB: 1950-07-23, 63 y.o.   MRN: [id number]  HPI  For 6 month recheck.  AIC has increased. Had eye exam Jan 7th at [facility]. Declines statin therapy. Says second dose of metformin is causing gas. AIC has increased during the holidays. Previously hemoglobin A1c was 6.9% in July 2014 and is now 7.2%. Sometimes doesn't take second dose of metformin. Compliance is an issue. He doesn't want to be on statin therapy. History of hypertriglyceridemia. 13 triglycerides were 117 and they're now 171. Total cholesterol in July 2014 was 145 and is now 165. LDL cholesterol in July 2014 was 85 and is now 64.    Review of Systems     Objective:   Physical Exam Skin warm and dry. Nodes None. Chest clear. Cor RRR normal S1/S2. Ext without edema        Assessment & Plan:  Type 2 diabetes mellitus  Hypertriglyceridemia  Elevated blood pressure  Plan: Encourage patient to take second dose of metformin 500 mg twice daily as previously prescribed. Keep record of Accu-Cheks. Diet and exercise. He declines medical therapy for hypertriglyceridemia. Return in 6 months for physical examination. He is to keep an eye on his blood pressure with home blood pressure readings and call me if persistently elevated. Declines ACE inhibitor for renal protection

## 2013-03-09 NOTE — Patient Instructions (Addendum)
Pt declines statin therapy.   Need to diet exercise and lose weight. Need to watch BP and glucose at home. Return in 6 months. He refuses ACE inhibitor for diabetes

## 2013-08-07 ENCOUNTER — Other Ambulatory Visit: Payer: Self-pay | Admitting: Internal Medicine

## 2013-09-05 ENCOUNTER — Encounter: Payer: Self-pay | Admitting: Internal Medicine

## 2013-09-06 ENCOUNTER — Other Ambulatory Visit: Payer: BC Managed Care – PPO | Admitting: Internal Medicine

## 2013-09-06 DIAGNOSIS — Z125 Encounter for screening for malignant neoplasm of prostate: Secondary | ICD-10-CM

## 2013-09-06 DIAGNOSIS — Z0000 Encounter for general adult medical examination without abnormal findings: Secondary | ICD-10-CM

## 2013-09-06 DIAGNOSIS — Z79899 Other long term (current) drug therapy: Secondary | ICD-10-CM

## 2013-09-06 DIAGNOSIS — E119 Type 2 diabetes mellitus without complications: Secondary | ICD-10-CM

## 2013-09-06 DIAGNOSIS — E785 Hyperlipidemia, unspecified: Secondary | ICD-10-CM

## 2013-09-06 LAB — COMPREHENSIVE METABOLIC PANEL
ALT: 19 U/L (ref 0–53)
AST: 25 U/L (ref 0–37)
Albumin: 4.1 g/dL (ref 3.5–5.2)
Alkaline Phosphatase: 59 U/L (ref 39–117)
BILIRUBIN TOTAL: 0.5 mg/dL (ref 0.2–1.2)
BUN: 14 mg/dL (ref 6–23)
CO2: 25 mEq/L (ref 19–32)
CREATININE: 0.84 mg/dL (ref 0.50–1.35)
Calcium: 9.3 mg/dL (ref 8.4–10.5)
Chloride: 103 mEq/L (ref 96–112)
Glucose, Bld: 159 mg/dL — ABNORMAL HIGH (ref 70–99)
Potassium: 4.5 mEq/L (ref 3.5–5.3)
Sodium: 140 mEq/L (ref 135–145)
Total Protein: 6.7 g/dL (ref 6.0–8.3)

## 2013-09-06 LAB — CBC WITH DIFFERENTIAL/PLATELET
BASOS ABS: 0.1 10*3/uL (ref 0.0–0.1)
Basophils Relative: 1 % (ref 0–1)
EOS ABS: 0.3 10*3/uL (ref 0.0–0.7)
EOS PCT: 4 % (ref 0–5)
HCT: 44.6 % (ref 39.0–52.0)
Hemoglobin: 15.3 g/dL (ref 13.0–17.0)
Lymphocytes Relative: 38 % (ref 12–46)
Lymphs Abs: 2.9 10*3/uL (ref 0.7–4.0)
MCH: 29.1 pg (ref 26.0–34.0)
MCHC: 34.3 g/dL (ref 30.0–36.0)
MCV: 85 fL (ref 78.0–100.0)
MONO ABS: 0.6 10*3/uL (ref 0.1–1.0)
Monocytes Relative: 8 % (ref 3–12)
Neutro Abs: 3.7 10*3/uL (ref 1.7–7.7)
Neutrophils Relative %: 49 % (ref 43–77)
Platelets: 211 10*3/uL (ref 150–400)
RBC: 5.25 MIL/uL (ref 4.22–5.81)
RDW: 13.4 % (ref 11.5–15.5)
WBC: 7.6 10*3/uL (ref 4.0–10.5)

## 2013-09-06 LAB — LIPID PANEL
CHOL/HDL RATIO: 3.1 ratio
CHOLESTEROL: 129 mg/dL (ref 0–200)
HDL: 41 mg/dL (ref >39)
LDL Cholesterol: 57 mg/dL (ref 0–99)
Triglycerides: 157 mg/dL — ABNORMAL HIGH (ref <150)
VLDL: 31 mg/dL (ref 0–40)

## 2013-09-06 LAB — HEMOGLOBIN A1C
Hgb A1c MFr Bld: 7.1 % — ABNORMAL HIGH (ref <5.7)
MEAN PLASMA GLUCOSE: 157 mg/dL — AB (ref <117)

## 2013-09-07 ENCOUNTER — Ambulatory Visit (INDEPENDENT_AMBULATORY_CARE_PROVIDER_SITE_OTHER): Payer: BC Managed Care – PPO | Admitting: Internal Medicine

## 2013-09-07 ENCOUNTER — Encounter: Payer: Self-pay | Admitting: Internal Medicine

## 2013-09-07 VITALS — BP 144/78 | HR 76 | Temp 97.7°F | Ht 71.5 in | Wt 240.0 lb

## 2013-09-07 DIAGNOSIS — Z0000 Encounter for general adult medical examination without abnormal findings: Secondary | ICD-10-CM

## 2013-09-07 DIAGNOSIS — IMO0001 Reserved for inherently not codable concepts without codable children: Secondary | ICD-10-CM

## 2013-09-07 DIAGNOSIS — R030 Elevated blood-pressure reading, without diagnosis of hypertension: Secondary | ICD-10-CM

## 2013-09-07 DIAGNOSIS — E119 Type 2 diabetes mellitus without complications: Secondary | ICD-10-CM

## 2013-09-07 DIAGNOSIS — E781 Pure hyperglyceridemia: Secondary | ICD-10-CM

## 2013-09-07 MED ORDER — METFORMIN HCL 500 MG PO TABS: ORAL_TABLET | ORAL | Status: DC

## 2013-09-07 MED ORDER — SILDENAFIL CITRATE 100 MG PO TABS: ORAL_TABLET | ORAL | Status: DC

## 2013-09-07 NOTE — Patient Instructions (Addendum)
Take Metformin twice a day. Return late September for OV and Hgb AIC and BP check. Diet exercise and weight loss.

## 2013-09-07 NOTE — Progress Notes (Signed)
   Subjective:    Patient ID: Steven Wheeler, male    DOB: December 03, 1950, 63 y.o.   MRN: [id number]  HPI  Says daughter is getting married in the near future his been under some stress. His blood pressure is elevated today. Has previously refused to take ACE inhibitor although he has type 2 diabetes mellitus. Has been taking metformin once daily. Says he will take it twice a day. He is here for physical examination. He is now 63 years of age. History of diabetes mellitus, hypertriglyceridemia, rectal dysfunction treated with Viagra.  Past medical history: History of fractured left arm at age 61. History of Hepatitis A around 1982.  Annual eye exam by Dr. Sherlean Foot each Fall.  Virtual colonoscopy in May 2009. And X. immunization 2010. Tetanus immunization 2004.  Wisdom teeth extracted x4 at age 52.  Social history: Nonsmoker, social alcohol consumption. Married. Wife is a retired Pharmacist, hospital. 2 adult daughters. He is a retired Hotel manager.  Family history: Father died of an MI. Mother age 48 with history of dementia and congestive heart failure as well in his left eye vision loss. One brother in good health. 3 sisters. One sister died of cancer. 2 sisters living one of whom has hypertension.    Review of Systems  Constitutional: Negative.   All other systems reviewed and are negative.      Objective:   Physical Exam  Vitals reviewed. Constitutional: He is oriented to person, place, and time. He appears well-developed and well-nourished. No distress.  HENT:  Head: Normocephalic and atraumatic.  Right Ear: External ear normal.  Left Ear: External ear normal.  Nose: Nose normal.  Mouth/Throat: Oropharynx is clear and moist. No oropharyngeal exudate.  Eyes: Conjunctivae and EOM are normal. Pupils are equal, round, and reactive to light. Right eye exhibits no discharge. Left eye exhibits no discharge. No scleral icterus.  Neck: Neck supple. No JVD present. No thyromegaly present.  Cardiovascular:  Normal rate, regular rhythm, normal heart sounds and intact distal pulses.   No murmur heard. Pulmonary/Chest: Effort normal and breath sounds normal. No respiratory distress. He has no wheezes. He has no rales.  Abdominal: Soft. Bowel sounds are normal. He exhibits no distension and no mass. There is no tenderness. There is no rebound and no guarding.  Genitourinary: Prostate normal.  Musculoskeletal: Normal range of motion. He exhibits no edema.  Lymphadenopathy:    He has no cervical adenopathy.  Neurological: He is alert and oriented to person, place, and time. He has normal reflexes.  Skin: Skin is warm and dry. No rash noted. He is not diaphoretic.  Psychiatric: He has a normal mood and affect. His behavior is normal. Judgment and thought content normal.          Assessment & Plan:  Hypertension  Does not want to take blood pressure medication at this point in time. Says he'll return in September for repeat blood pressure check. I wanted him to start him on an ACE inhibitor with history of diabetes but he refuses  Diabetes mellitus  Has not been taking metformin twice daily as directed. Will only take it once daily. Agrees to try it twice daily and see if hemoglobin A1c will improve. Return in September for followup. Refill metformin.  Erectile dysfunction-refill Viagra for one year

## 2013-09-08 LAB — MICROALBUMIN, URINE: MICROALB UR: 0.72 mg/dL (ref 0.00–1.89)

## 2013-11-08 ENCOUNTER — Other Ambulatory Visit: Payer: Self-pay | Admitting: Internal Medicine

## 2013-11-09 ENCOUNTER — Encounter: Payer: Self-pay | Admitting: Internal Medicine

## 2013-11-09 ENCOUNTER — Ambulatory Visit (INDEPENDENT_AMBULATORY_CARE_PROVIDER_SITE_OTHER): Payer: BC Managed Care – PPO | Admitting: Internal Medicine

## 2013-11-09 VITALS — BP 130/80 | HR 80 | Ht 71.5 in | Wt 232.0 lb

## 2013-11-09 DIAGNOSIS — E119 Type 2 diabetes mellitus without complications: Secondary | ICD-10-CM

## 2013-11-09 DIAGNOSIS — E781 Pure hyperglyceridemia: Secondary | ICD-10-CM

## 2013-11-09 DIAGNOSIS — R030 Elevated blood-pressure reading, without diagnosis of hypertension: Secondary | ICD-10-CM

## 2013-11-09 DIAGNOSIS — Z23 Encounter for immunization: Secondary | ICD-10-CM

## 2013-11-09 NOTE — Progress Notes (Addendum)
   Subjective:    Patient ID: Steven Wheeler, male    DOB: 12-Jul-1950, 63 y.o.   MRN: [id number]  HPI  Here today for recheck on hypertension. When he was here in July his blood pressure was elevated at 144/78. He did not want to start ACE inhibitor. He has a history of diabetes mellitus and is on metformin. Says he is now taking that twice daily instead of once daily. Hemoglobin A1c drawn. Refuses to be on antihypertensive medication at this point in time. Blood pressure today is 130/80 after resting for a few minutes here in the office.    Review of Systems     Objective:   Physical Exam  Chest clear. Cardiac exam regular rate and rhythm. Extremities without edema      Assessment & Plan:  Borderline hypertension  Type 2 diabetes mellitus  Hypertriglyceridemia-in July of triglycerides had improved from 171 to157. These were not repeated today. He takes [employer].  Plan: Return in January for physical examination and fasting lab studies. Continue metformin twice daily. For now he will stay off ACE inhibitor at his request. Addendum: His hemoglobin A1c is 7.6% which is worse than it was in July. I have mailed a prescription for him to purchase a home glucose monitoring diabetic test strips and lancets to do twice a day testing. He should return in 6 months. He really needs to watch his diet and exercise. He needs to take this seriously. Needs to take metformin twice daily.

## 2013-11-10 LAB — HEMOGLOBIN A1C
Hgb A1c MFr Bld: 7.6 % — ABNORMAL HIGH (ref <5.7)
Mean Plasma Glucose: 171 mg/dL — ABNORMAL HIGH (ref <117)

## 2013-11-12 ENCOUNTER — Telehealth: Payer: Self-pay

## 2013-11-12 NOTE — Telephone Encounter (Signed)
Left message for patient to call office regarding his lab results and to work on his diet and exercise and recheck labs in January.

## 2013-11-12 NOTE — Telephone Encounter (Signed)
Message copied by Amado Coe on Mon Nov 12, 2013  1:18 PM ------      Message from: Elby Showers      Created: Sat Nov 10, 2013 10:57 AM       AIC has increased. Diet and exercise and recheck in January. ------

## 2013-11-13 NOTE — Telephone Encounter (Signed)
Patient informed. 

## 2014-03-01 ENCOUNTER — Other Ambulatory Visit: Payer: Self-pay | Admitting: Internal Medicine

## 2014-03-11 ENCOUNTER — Other Ambulatory Visit: Payer: BC Managed Care – PPO | Admitting: Internal Medicine

## 2014-03-11 DIAGNOSIS — Z13220 Encounter for screening for lipoid disorders: Secondary | ICD-10-CM

## 2014-03-11 DIAGNOSIS — E119 Type 2 diabetes mellitus without complications: Secondary | ICD-10-CM

## 2014-03-11 LAB — LIPID PANEL
Cholesterol: 140 mg/dL (ref 0–200)
HDL: 34 mg/dL — AB (ref >39)
LDL Cholesterol: 60 mg/dL (ref 0–99)
Total CHOL/HDL Ratio: 4.1 Ratio
Triglycerides: 230 mg/dL — ABNORMAL HIGH (ref <150)
VLDL: 46 mg/dL — AB (ref 0–40)

## 2014-03-11 LAB — HEMOGLOBIN A1C
Hgb A1c MFr Bld: 7.2 % — ABNORMAL HIGH (ref <5.7)
Mean Plasma Glucose: 160 mg/dL — ABNORMAL HIGH (ref <117)

## 2014-03-12 ENCOUNTER — Encounter: Payer: Self-pay | Admitting: Internal Medicine

## 2014-03-12 ENCOUNTER — Ambulatory Visit (INDEPENDENT_AMBULATORY_CARE_PROVIDER_SITE_OTHER): Payer: BC Managed Care – PPO | Admitting: Internal Medicine

## 2014-03-12 VITALS — BP 144/82 | HR 65 | Temp 97.8°F | Wt 224.0 lb

## 2014-03-12 DIAGNOSIS — E781 Pure hyperglyceridemia: Secondary | ICD-10-CM

## 2014-03-12 DIAGNOSIS — E119 Type 2 diabetes mellitus without complications: Secondary | ICD-10-CM

## 2014-03-12 NOTE — Progress Notes (Signed)
   Subjective:    Patient ID: Steven Wheeler, male    DOB: 1951/02/16, 64 y.o.   MRN: [id number]  HPI  In today for six-month recheck. Has a history of type 2 diabetes mellitus. He is on metformin. He has lost 16 pounds since last visit. Under some stress because brother-in-law has prostate and bladder cancer. They have been taking him to [facility] for treatments. Blood pressure is elevated just a bit today. He did receive flu vaccine. He will be on Medicare starting in March. Does not want to take lipid-lowering medication. Holidays have elevated his triglycerides. Says he will reconsider in July at time of his physical exam.  He is to have an eye exam by optometrist at [facility] in February.  Review of Systems     Objective:   Physical Exam  Skin warm and dry. Nodes none. Neck supple without JVD thyromegaly or carotid bruits. Chest clear to auscultation. Cardiac exam regular rate and rhythm normal S1 and S2. Extremities without edema.      Assessment & Plan:  Controlled Type 2 diabetes mellitus with improvement of hemoglobin A1c to 7.2%  Hypertriglyceridemia-usually lipids are normal. He probably ate too much during the holidays. Reevaluate in 6 months. He refuses to be a lipid-lowering medication  Elevated blood pressure-continue to monitor. Consider adding ACE inhibitor at next visit  Plan: Return in July for physical examination. He will be on Medicare at that time.

## 2014-03-12 NOTE — Patient Instructions (Signed)
Continue diet and exercise. Return in 6 months for physical exam.

## 2014-07-10 DIAGNOSIS — M9900 Segmental and somatic dysfunction of head region: Secondary | ICD-10-CM | POA: Diagnosis not present

## 2014-07-10 DIAGNOSIS — M9903 Segmental and somatic dysfunction of lumbar region: Secondary | ICD-10-CM | POA: Diagnosis not present

## 2014-07-10 DIAGNOSIS — M5116 Intervertebral disc disorders with radiculopathy, lumbar region: Secondary | ICD-10-CM | POA: Diagnosis not present

## 2014-07-10 DIAGNOSIS — M9901 Segmental and somatic dysfunction of cervical region: Secondary | ICD-10-CM | POA: Diagnosis not present

## 2014-07-10 DIAGNOSIS — M5020 Other cervical disc displacement, unspecified cervical region: Secondary | ICD-10-CM | POA: Diagnosis not present

## 2014-07-10 DIAGNOSIS — M5022 Other cervical disc displacement, mid-cervical region: Secondary | ICD-10-CM | POA: Diagnosis not present

## 2014-08-14 DIAGNOSIS — M9901 Segmental and somatic dysfunction of cervical region: Secondary | ICD-10-CM | POA: Diagnosis not present

## 2014-08-14 DIAGNOSIS — M9903 Segmental and somatic dysfunction of lumbar region: Secondary | ICD-10-CM | POA: Diagnosis not present

## 2014-08-14 DIAGNOSIS — M5020 Other cervical disc displacement, unspecified cervical region: Secondary | ICD-10-CM | POA: Diagnosis not present

## 2014-08-14 DIAGNOSIS — M5116 Intervertebral disc disorders with radiculopathy, lumbar region: Secondary | ICD-10-CM | POA: Diagnosis not present

## 2014-08-14 DIAGNOSIS — M9900 Segmental and somatic dysfunction of head region: Secondary | ICD-10-CM | POA: Diagnosis not present

## 2014-08-14 DIAGNOSIS — M5022 Other cervical disc displacement, mid-cervical region: Secondary | ICD-10-CM | POA: Diagnosis not present

## 2014-08-19 DIAGNOSIS — M9903 Segmental and somatic dysfunction of lumbar region: Secondary | ICD-10-CM | POA: Diagnosis not present

## 2014-08-19 DIAGNOSIS — M5116 Intervertebral disc disorders with radiculopathy, lumbar region: Secondary | ICD-10-CM | POA: Diagnosis not present

## 2014-08-19 DIAGNOSIS — M5020 Other cervical disc displacement, unspecified cervical region: Secondary | ICD-10-CM | POA: Diagnosis not present

## 2014-08-19 DIAGNOSIS — M9900 Segmental and somatic dysfunction of head region: Secondary | ICD-10-CM | POA: Diagnosis not present

## 2014-08-19 DIAGNOSIS — M9901 Segmental and somatic dysfunction of cervical region: Secondary | ICD-10-CM | POA: Diagnosis not present

## 2014-08-19 DIAGNOSIS — M5022 Other cervical disc displacement, mid-cervical region: Secondary | ICD-10-CM | POA: Diagnosis not present

## 2014-08-21 DIAGNOSIS — M9901 Segmental and somatic dysfunction of cervical region: Secondary | ICD-10-CM | POA: Diagnosis not present

## 2014-08-21 DIAGNOSIS — M9903 Segmental and somatic dysfunction of lumbar region: Secondary | ICD-10-CM | POA: Diagnosis not present

## 2014-08-21 DIAGNOSIS — M5022 Other cervical disc displacement, mid-cervical region: Secondary | ICD-10-CM | POA: Diagnosis not present

## 2014-08-21 DIAGNOSIS — M5020 Other cervical disc displacement, unspecified cervical region: Secondary | ICD-10-CM | POA: Diagnosis not present

## 2014-08-21 DIAGNOSIS — M5116 Intervertebral disc disorders with radiculopathy, lumbar region: Secondary | ICD-10-CM | POA: Diagnosis not present

## 2014-08-21 DIAGNOSIS — M9900 Segmental and somatic dysfunction of head region: Secondary | ICD-10-CM | POA: Diagnosis not present

## 2014-09-09 ENCOUNTER — Other Ambulatory Visit: Payer: BC Managed Care – PPO | Admitting: Internal Medicine

## 2014-09-09 ENCOUNTER — Other Ambulatory Visit: Payer: Self-pay | Admitting: Internal Medicine

## 2014-09-09 DIAGNOSIS — E785 Hyperlipidemia, unspecified: Secondary | ICD-10-CM

## 2014-09-09 DIAGNOSIS — Z125 Encounter for screening for malignant neoplasm of prostate: Secondary | ICD-10-CM

## 2014-09-09 DIAGNOSIS — Z79899 Other long term (current) drug therapy: Secondary | ICD-10-CM

## 2014-09-09 DIAGNOSIS — E119 Type 2 diabetes mellitus without complications: Secondary | ICD-10-CM | POA: Diagnosis not present

## 2014-09-09 LAB — LIPID PANEL
Cholesterol: 132 mg/dL (ref 0–200)
HDL: 36 mg/dL — ABNORMAL LOW (ref >=40)
LDL CALC: 74 mg/dL (ref 0–99)
TRIGLYCERIDES: 108 mg/dL (ref <150)
Total CHOL/HDL Ratio: 3.7 Ratio
VLDL: 22 mg/dL (ref 0–40)

## 2014-09-09 LAB — CBC WITH DIFFERENTIAL/PLATELET
BASOS PCT: 1 % (ref 0–1)
Basophils Absolute: 0.1 10*3/uL (ref 0.0–0.1)
EOS ABS: 0.3 10*3/uL (ref 0.0–0.7)
EOS PCT: 4 % (ref 0–5)
HCT: 44.9 % (ref 39.0–52.0)
Hemoglobin: 14.8 g/dL (ref 13.0–17.0)
LYMPHS PCT: 37 % (ref 12–46)
Lymphs Abs: 2.3 10*3/uL (ref 0.7–4.0)
MCH: 28.5 pg (ref 26.0–34.0)
MCHC: 33 g/dL (ref 30.0–36.0)
MCV: 86.5 fL (ref 78.0–100.0)
MONOS PCT: 9 % (ref 3–12)
MPV: 9.5 fL (ref 8.6–12.4)
Monocytes Absolute: 0.6 10*3/uL (ref 0.1–1.0)
NEUTROS PCT: 49 % (ref 43–77)
Neutro Abs: 3.1 10*3/uL (ref 1.7–7.7)
Platelets: 220 10*3/uL (ref 150–400)
RBC: 5.19 MIL/uL (ref 4.22–5.81)
RDW: 13.3 % (ref 11.5–15.5)
WBC: 6.3 10*3/uL (ref 4.0–10.5)

## 2014-09-09 LAB — COMPREHENSIVE METABOLIC PANEL
ALBUMIN: 4.2 g/dL (ref 3.5–5.2)
ALT: 21 U/L (ref 0–53)
AST: 26 U/L (ref 0–37)
Alkaline Phosphatase: 60 U/L (ref 39–117)
BUN: 13 mg/dL (ref 6–23)
CHLORIDE: 105 meq/L (ref 96–112)
CO2: 27 meq/L (ref 19–32)
CREATININE: 0.85 mg/dL (ref 0.50–1.35)
Calcium: 9.3 mg/dL (ref 8.4–10.5)
Glucose, Bld: 163 mg/dL — ABNORMAL HIGH (ref 70–99)
Potassium: 4.5 mEq/L (ref 3.5–5.3)
Sodium: 141 mEq/L (ref 135–145)
Total Bilirubin: 0.6 mg/dL (ref 0.2–1.2)
Total Protein: 6.7 g/dL (ref 6.0–8.3)

## 2014-09-09 LAB — HEMOGLOBIN A1C
Hgb A1c MFr Bld: 7.8 % — ABNORMAL HIGH (ref <5.7)
MEAN PLASMA GLUCOSE: 177 mg/dL — AB (ref <117)

## 2014-09-10 ENCOUNTER — Ambulatory Visit (INDEPENDENT_AMBULATORY_CARE_PROVIDER_SITE_OTHER): Payer: Medicare Other | Admitting: Internal Medicine

## 2014-09-10 ENCOUNTER — Encounter: Payer: Self-pay | Admitting: Internal Medicine

## 2014-09-10 VITALS — BP 140/80 | HR 78 | Temp 98.2°F | Ht 70.5 in | Wt 235.0 lb

## 2014-09-10 DIAGNOSIS — E8881 Metabolic syndrome: Secondary | ICD-10-CM | POA: Diagnosis not present

## 2014-09-10 DIAGNOSIS — R030 Elevated blood-pressure reading, without diagnosis of hypertension: Secondary | ICD-10-CM | POA: Diagnosis not present

## 2014-09-10 DIAGNOSIS — E119 Type 2 diabetes mellitus without complications: Secondary | ICD-10-CM

## 2014-09-10 DIAGNOSIS — E785 Hyperlipidemia, unspecified: Secondary | ICD-10-CM | POA: Diagnosis not present

## 2014-09-10 DIAGNOSIS — Z0000 Encounter for general adult medical examination without abnormal findings: Secondary | ICD-10-CM | POA: Diagnosis not present

## 2014-09-10 DIAGNOSIS — E669 Obesity, unspecified: Secondary | ICD-10-CM | POA: Diagnosis not present

## 2014-09-10 DIAGNOSIS — N529 Male erectile dysfunction, unspecified: Secondary | ICD-10-CM | POA: Diagnosis not present

## 2014-09-10 LAB — PSA: PSA: 0.5 ng/mL (ref <=4.00)

## 2014-09-10 LAB — POCT URINALYSIS DIPSTICK
Bilirubin, UA: NEGATIVE
Blood, UA: NEGATIVE
GLUCOSE UA: NEGATIVE
KETONES UA: NEGATIVE
Leukocytes, UA: NEGATIVE
Nitrite, UA: NEGATIVE
PROTEIN UA: NEGATIVE
Spec Grav, UA: 1.02
Urobilinogen, UA: NEGATIVE
pH, UA: 6

## 2014-09-10 NOTE — Progress Notes (Signed)
Subjective:    Patient ID: Steven Wheeler, male    DOB: [birth year], 64 y.o.   MRN: [id number]        64 year old White Male for Welcome to Medicare Exam.   Has been walking 3 miles daily. History of hyperlipidemia, controlled type 2 diabetes mellitus, obesity, erectile dysfunction. Sometimes forgets to take second dose of metformin. Does not want to be on lipid-lowering medication. Unfortunately, weight has increased 11 pounds since 2014. HPI Past medical history: History of fractured left arm at age 62. History of Hepatitis A around 1982.  Annual eye exam by Dr. Sherlean Foot each Fall.  Virtual colonoscopy in May 2009. Pneumovax immunization 2010. Tetanus immunization 2004.  Wisdom teeth extracted x4 at age 6.  Social history: Nonsmoker, social alcohol consumption. Married. Wife is a retired Pharmacist, hospital. 2 adult daughters. He is a retired Water engineer.  Family history: Father died of an MI. Mother age 59 with history of dementia and congestive heart failure as well in his left eye vision loss. One brother in good health. 3 sisters. One sister died of cancer. 2 sisters living, one of whom has hypertension.       Review of Systems  Constitutional: Negative.   All other systems reviewed and are negative.      Objective:   Physical Exam  Constitutional: He is oriented to person, place, and time. He appears well-developed and well-nourished. No distress.  HENT:  Head: Normocephalic and atraumatic.  Right Ear: External ear normal.  Left Ear: External ear normal.  Mouth/Throat: Oropharynx is clear and moist. No oropharyngeal exudate.  Eyes: Conjunctivae are normal. Pupils are equal, round, and reactive to light. Right eye exhibits no discharge. Left eye exhibits no discharge. No scleral icterus.  Neck: Neck supple. No JVD present. No thyromegaly present.  Cardiovascular: Normal rate, regular rhythm, normal heart sounds and intact distal pulses.   No murmur heard. Pulmonary/Chest:  Effort normal and breath sounds normal. No respiratory distress. He has no wheezes. He has no rales.  Abdominal: Soft. Bowel sounds are normal. He exhibits no distension and no mass. There is no tenderness. There is no rebound and no guarding.  Genitourinary: Prostate normal.  Musculoskeletal: Normal range of motion. He exhibits no edema.  Lymphadenopathy:    He has no cervical adenopathy.  Neurological: He is alert and oriented to person, place, and time. He has normal reflexes. No cranial nerve deficit.  Skin: Skin is warm and dry. No rash noted. He is not diaphoretic.  Psychiatric: He has a normal mood and affect. His behavior is normal. Judgment and thought content normal.  Vitals reviewed.         Assessment & Plan:  Normal health maintenance exam  Hyperlipidemia  Controlled type 2 diabetes mellitus  Obesity  History of erectile dysfunction  Metabolic syndrome  Borderline hypertension  Plan: Continue diet exercise and weight loss efforts. Continue same medications. Referral to dietitian. Return in October   Subjective:   Patient presents for Medicare Annual/Subsequent preventive examination.  Review Past Medical/Family/Social: See above  Risk Factors  Current exercise habits: Walking 3 miles daily Dietary issues discussed: Low fat low carb  Cardiac risk factors:  Depression Screen  (Note: if answer to either of the following is "Yes", a more complete depression screening is indicated)   Over the past two weeks, have you felt down, depressed or hopeless? No  Over the past two weeks, have you felt little interest or pleasure in doing things? No  Have you lost interest or pleasure in daily life? No Do you often feel hopeless? No Do you cry easily over simple problems? No   Activities of Daily Living  In your present state of health, do you have any difficulty performing the following activities?:   Driving? No  Managing money? No  Feeding yourself? No    Getting from bed to chair? No  Climbing a flight of stairs? No  Preparing food and eating?: No  Bathing or showering? No  Getting dressed: No  Getting to the toilet? No  Using the toilet:No  Moving around from place to place: No  In the past year have you fallen or had a near fall?:No  Are you sexually active? yes Do you have more than one partner? No   Hearing Difficulties: No  Do you often ask people to speak up or repeat themselves? No  Do you experience ringing or noises in your ears? No  Do you have difficulty understanding soft or whispered voices? No  Do you feel that you have a problem with memory? No Do you often misplace items? No    Home Safety:  Do you have a smoke alarm at your residence? Yes Do you have grab bars in the bathroom? Yes Do you have throw rugs in your house? Yes   Cognitive Testing  Alert? Yes Normal Appearance?Yes  Oriented to person? Yes Place? Yes  Time? Yes  Recall of three objects? Yes  Can perform simple calculations? Yes  Displays appropriate judgment?Yes  Can read the correct time from a watch face?Yes   List the Names of Other Physician/Practitioners you currently use:  See referral list for the physicians patient is currently seeing.  Dr. Sherlean Foot, optometrist   Review of Systems: See above  Objective:     General appearance: Appears stated age and mildly obese  Head: Normocephalic, without obvious abnormality, atraumatic  Eyes: conj clear, EOMi PEERLA  Ears: normal TM's and external ear canals both ears  Nose: Nares normal. Septum midline. Mucosa normal. No drainage or sinus tenderness.  Throat: lips, mucosa, and tongue normal; teeth and gums normal  Neck: no adenopathy, no carotid bruit, no JVD, supple, symmetrical, trachea midline and thyroid not enlarged, symmetric, no tenderness/mass/nodules  No CVA tenderness.  Lungs: clear to auscultation bilaterally  Breasts: normal appearance, no masses or tenderness Heart: regular  rate and rhythm, S1, S2 normal, no murmur, click, rub or gallop  Abdomen: soft, non-tender; bowel sounds normal; no masses, no organomegaly  Musculoskeletal: ROM normal in all joints, no crepitus, no deformity, Normal muscle strengthen. Back  is symmetric, no curvature. Skin: Skin color, texture, turgor normal. No rashes or lesions  Lymph nodes: Cervical, supraclavicular, and axillary nodes normal.  Neurologic: CN 2 -12 Normal, Normal symmetric reflexes. Normal coordination and gait  Psych: Alert & Oriented x 3, Mood appear stable.    Assessment:    Annual wellness medicare exam   Plan:    During the course of the visit the patient was educated and counseled about appropriate screening and preventive services including:  Annual eye exam each fall  Due for Prevnar  Tetanus immunization up-to-date  Annual PSA      Patient Instructions (the written plan) was given to the patient.  Medicare Attestation  I have personally reviewed:  The patient's medical and social history  Their use of alcohol, tobacco or illicit drugs  Their current medications and supplements  The patient's functional ability including ADLs,fall risks, home safety risks,  cognitive, and hearing and visual impairment  Diet and physical activities  Evidence for depression or mood disorders  The patient's weight, height, BMI, and visual acuity have been recorded in the chart. I have made referrals, counseling, and provided education to the patient based on review of the above and I have provided the patient with a written personalized care plan for preventive services.

## 2014-09-21 ENCOUNTER — Encounter: Payer: Self-pay | Admitting: Internal Medicine

## 2014-09-21 DIAGNOSIS — R030 Elevated blood-pressure reading, without diagnosis of hypertension: Secondary | ICD-10-CM | POA: Insufficient documentation

## 2014-09-21 DIAGNOSIS — E8881 Metabolic syndrome: Secondary | ICD-10-CM | POA: Insufficient documentation

## 2014-09-21 NOTE — Patient Instructions (Addendum)
Continue diet and exercise efforts. Return in October. Referral to dietitian. Watch blood pressure.

## 2014-10-08 ENCOUNTER — Encounter: Payer: Medicare Other | Attending: Internal Medicine | Admitting: *Deleted

## 2014-10-08 ENCOUNTER — Encounter: Payer: Self-pay | Admitting: *Deleted

## 2014-10-08 VITALS — Ht 71.5 in | Wt 231.7 lb

## 2014-10-08 DIAGNOSIS — E119 Type 2 diabetes mellitus without complications: Secondary | ICD-10-CM | POA: Insufficient documentation

## 2014-10-08 DIAGNOSIS — Z713 Dietary counseling and surveillance: Secondary | ICD-10-CM | POA: Insufficient documentation

## 2014-10-08 NOTE — Patient Instructions (Signed)
Plan:  Aim for 3 Carb Choices per meal (45 grams) +/- 1 either way  Aim for 0-15 Carbs per snack if hungry  Include protein in moderation with your meals and snacks Consider reading food labels for Total Carbohydrate and Fat Grams of foods Continue your activity level daily as tolerated Continue taking medication as directed by MD  [place] for snack  Keep up the good work with your exercise!!! Your food choices are OK, just put the puzzle together differently Eat Dinner by 6:00 Have small carb & protein snack before bed is desired Limit fried foods It's not necessarily the food we eat, it's what we do to it!  Always have breakfast Spaghetti Squash

## 2014-10-08 NOTE — Progress Notes (Signed)
Diabetes Self-Management Education  Visit Type: First/Initial  Appt. Start Time: 1400 Appt. End Time: 3295  10/08/2014  Steven Wheeler, identified by name and date of birth, is a 64 y.o. male with a diagnosis of Diabetes: Type 2. Patient presents with his wife for Diabetic Self Management Education.   ASSESSMENT  Height 5' 11.5" (1.816 m), weight 231 lb 11.2 oz (105.098 kg). Body mass index is 31.87 kg/(m^2).      Diabetes Self-Management Education - 10/08/14 1449    Visit Information   Visit Type First/Initial   Initial Visit   Diabetes Type Type 2   Are you currently following a meal plan? No   Are you taking your medications as prescribed? Yes   Health Coping   How would you rate your overall health? Excellent   Psychosocial Assessment   Patient Belief/Attitude about Diabetes Motivated to manage diabetes   Self-care barriers None   Self-management support Doctor's office;Family;CDE visits   Other persons present Patient;Spouse/SO   Patient Concerns Nutrition/Meal planning;Glycemic Control   Special Needs None   Preferred Learning Style No preference indicated   Learning Readiness Change in progress   How often do you need to have someone help you when you read instructions, pamphlets, or other written materials from your doctor or pharmacy? 1 - Never   Complications   Last HgB A1C per patient/outside source 7.8 %   How often do you check your blood sugar? 0 times/day (not testing)   Have you had a dilated eye exam in the past 12 months? Yes   Have you had a dental exam in the past 12 months? Yes   Are you checking your feet? Yes   Dietary Intake   Breakfast cup of coffee,  french vanilla cream   Lunch 11:00  baked chicken, lima beans, vegetable beef soup, corn bread, 1/2 & 1/2 tea   Dinner 7:00  flounder/fried ( broiled talapia) french fries / sweet potato , green beans, salad   Snack (evening) fudgesicle,   Beverage(s) ice tea, coffee, water, diet soda   Exercise   Exercise Type Moderate (swimming / aerobic walking)   How many days per week to you exercise? 6   How many minutes per day do you exercise? 40   Total minutes per week of exercise 240   Patient Education   Previous Diabetes Education No   Disease state  Definition of diabetes, type 1 and 2, and the diagnosis of diabetes;Factors that contribute to the development of diabetes   Nutrition management  Role of diet in the treatment of diabetes and the relationship between the three main macronutrients and blood glucose level;Food label reading, portion sizes and measuring food.;Carbohydrate counting;Meal timing in regards to the patients' current diabetes medication.;Information on hints to eating out and maintain blood glucose control.   Physical activity and exercise  Role of exercise on diabetes management, blood pressure control and cardiac health.   Medications Reviewed patients medication for diabetes, action, purpose, timing of dose and side effects.   Chronic complications Relationship between chronic complications and blood glucose control;Assessed and discussed foot care and prevention of foot problems;Lipid levels, blood glucose control and heart disease;Dental care   Psychosocial adjustment Role of stress on diabetes   Individualized Goals (developed by patient)   Nutrition General guidelines for healthy choices and portions discussed   Physical Activity Exercise 5-7 days per week   Medications take my medication as prescribed   Problem Solving Eat breakfast Daily  Reducing Risk do foot checks daily;increase portions of nuts and seeds   Outcomes   Expected Outcomes Demonstrated interest in learning. Expect positive outcomes   Future DMSE PRN   Program Status Completed      Individualized Plan for Diabetes Self-Management Training:   Learning Objective:  Patient will have a greater understanding of diabetes self-management. Patient education plan is to attend individual  and/or group sessions per assessed needs and concerns.   Plan:   Patient Instructions  Plan:  Aim for 3 Carb Choices per meal (45 grams) +/- 1 either way  Aim for 0-15 Carbs per snack if hungry  Include protein in moderation with your meals and snacks Consider reading food labels for Total Carbohydrate and Fat Grams of foods Continue your activity level daily as tolerated Continue taking medication as directed by MD  [place] for snack  Keep up the good work with your exercise!!! Your food choices are OK, just put the puzzle together differently Eat Dinner by 6:00 Have small carb & protein snack before bed is desired Limit fried foods It's not necessarily the food we eat, it's what we do to it!  Always have breakfast Spaghetti Squash   Expected Outcomes:  Demonstrated interest in learning. Expect positive outcomes  Education material provided: Living Well with Diabetes, A1C conversion sheet, Meal plan card, My Plate and Snack sheet  If problems or questions, patient to contact team via:  Phone  Future DSME appointment: PRN

## 2014-11-18 DIAGNOSIS — M9901 Segmental and somatic dysfunction of cervical region: Secondary | ICD-10-CM | POA: Diagnosis not present

## 2014-11-18 DIAGNOSIS — M9903 Segmental and somatic dysfunction of lumbar region: Secondary | ICD-10-CM | POA: Diagnosis not present

## 2014-11-18 DIAGNOSIS — M9900 Segmental and somatic dysfunction of head region: Secondary | ICD-10-CM | POA: Diagnosis not present

## 2014-11-18 DIAGNOSIS — M5116 Intervertebral disc disorders with radiculopathy, lumbar region: Secondary | ICD-10-CM | POA: Diagnosis not present

## 2014-11-18 DIAGNOSIS — M5020 Other cervical disc displacement, unspecified cervical region: Secondary | ICD-10-CM | POA: Diagnosis not present

## 2014-11-18 DIAGNOSIS — M5022 Other cervical disc displacement, mid-cervical region: Secondary | ICD-10-CM | POA: Diagnosis not present

## 2014-11-19 DIAGNOSIS — M9901 Segmental and somatic dysfunction of cervical region: Secondary | ICD-10-CM | POA: Diagnosis not present

## 2014-11-19 DIAGNOSIS — M5020 Other cervical disc displacement, unspecified cervical region: Secondary | ICD-10-CM | POA: Diagnosis not present

## 2014-11-19 DIAGNOSIS — M5022 Other cervical disc displacement, mid-cervical region: Secondary | ICD-10-CM | POA: Diagnosis not present

## 2014-11-19 DIAGNOSIS — M5116 Intervertebral disc disorders with radiculopathy, lumbar region: Secondary | ICD-10-CM | POA: Diagnosis not present

## 2014-11-19 DIAGNOSIS — M9903 Segmental and somatic dysfunction of lumbar region: Secondary | ICD-10-CM | POA: Diagnosis not present

## 2014-11-19 DIAGNOSIS — M9900 Segmental and somatic dysfunction of head region: Secondary | ICD-10-CM | POA: Diagnosis not present

## 2014-11-20 DIAGNOSIS — M9903 Segmental and somatic dysfunction of lumbar region: Secondary | ICD-10-CM | POA: Diagnosis not present

## 2014-11-20 DIAGNOSIS — M5020 Other cervical disc displacement, unspecified cervical region: Secondary | ICD-10-CM | POA: Diagnosis not present

## 2014-11-20 DIAGNOSIS — M9901 Segmental and somatic dysfunction of cervical region: Secondary | ICD-10-CM | POA: Diagnosis not present

## 2014-11-20 DIAGNOSIS — M9900 Segmental and somatic dysfunction of head region: Secondary | ICD-10-CM | POA: Diagnosis not present

## 2014-11-20 DIAGNOSIS — M5022 Other cervical disc displacement, mid-cervical region: Secondary | ICD-10-CM | POA: Diagnosis not present

## 2014-11-20 DIAGNOSIS — M5116 Intervertebral disc disorders with radiculopathy, lumbar region: Secondary | ICD-10-CM | POA: Diagnosis not present

## 2014-12-04 ENCOUNTER — Ambulatory Visit (INDEPENDENT_AMBULATORY_CARE_PROVIDER_SITE_OTHER): Payer: Medicare Other | Admitting: Internal Medicine

## 2014-12-04 VITALS — Temp 98.1°F

## 2014-12-04 DIAGNOSIS — Z23 Encounter for immunization: Secondary | ICD-10-CM

## 2014-12-09 ENCOUNTER — Other Ambulatory Visit: Payer: Medicare Other | Admitting: Internal Medicine

## 2014-12-09 ENCOUNTER — Other Ambulatory Visit: Payer: Self-pay | Admitting: Internal Medicine

## 2014-12-09 DIAGNOSIS — R030 Elevated blood-pressure reading, without diagnosis of hypertension: Secondary | ICD-10-CM

## 2014-12-09 DIAGNOSIS — E785 Hyperlipidemia, unspecified: Secondary | ICD-10-CM | POA: Diagnosis not present

## 2014-12-09 DIAGNOSIS — E119 Type 2 diabetes mellitus without complications: Secondary | ICD-10-CM | POA: Diagnosis not present

## 2014-12-09 DIAGNOSIS — IMO0001 Reserved for inherently not codable concepts without codable children: Secondary | ICD-10-CM

## 2014-12-09 LAB — HEPATIC FUNCTION PANEL
ALBUMIN: 4.6 g/dL (ref 3.6–5.1)
ALT: 21 U/L (ref 9–46)
AST: 27 U/L (ref 10–35)
Alkaline Phosphatase: 71 U/L (ref 40–115)
BILIRUBIN DIRECT: 0.1 mg/dL (ref <=0.2)
Indirect Bilirubin: 0.5 mg/dL (ref 0.2–1.2)
TOTAL PROTEIN: 7.7 g/dL (ref 6.1–8.1)
Total Bilirubin: 0.6 mg/dL (ref 0.2–1.2)

## 2014-12-09 LAB — LIPID PANEL
CHOL/HDL RATIO: 4.5 ratio (ref <=5.0)
Cholesterol: 171 mg/dL (ref 125–200)
HDL: 38 mg/dL — ABNORMAL LOW (ref >=40)
LDL Cholesterol: 107 mg/dL (ref <130)
Triglycerides: 128 mg/dL (ref <150)
VLDL: 26 mg/dL (ref <30)

## 2014-12-10 ENCOUNTER — Ambulatory Visit (INDEPENDENT_AMBULATORY_CARE_PROVIDER_SITE_OTHER): Payer: Medicare Other | Admitting: Internal Medicine

## 2014-12-10 ENCOUNTER — Encounter: Payer: Self-pay | Admitting: Internal Medicine

## 2014-12-10 ENCOUNTER — Telehealth: Payer: Self-pay

## 2014-12-10 VITALS — BP 138/74 | HR 67 | Temp 98.6°F | Ht 71.5 in | Wt 216.0 lb

## 2014-12-10 DIAGNOSIS — F411 Generalized anxiety disorder: Secondary | ICD-10-CM | POA: Diagnosis not present

## 2014-12-10 DIAGNOSIS — E781 Pure hyperglyceridemia: Secondary | ICD-10-CM

## 2014-12-10 DIAGNOSIS — F439 Reaction to severe stress, unspecified: Secondary | ICD-10-CM

## 2014-12-10 DIAGNOSIS — E119 Type 2 diabetes mellitus without complications: Secondary | ICD-10-CM | POA: Diagnosis not present

## 2014-12-10 DIAGNOSIS — Z658 Other specified problems related to psychosocial circumstances: Secondary | ICD-10-CM | POA: Diagnosis not present

## 2014-12-10 NOTE — Telephone Encounter (Signed)
Call to [facility] to add A1C to lab orders per Dr Renold Genta.

## 2014-12-10 NOTE — Patient Instructions (Signed)
Patient will return in February for follow-up on diabetes and weight loss efforts. Please call if you need something for anxiety.

## 2014-12-10 NOTE — Progress Notes (Signed)
   Subjective:    Patient ID: Steven Wheeler, male    DOB: 1950-07-27, 64 y.o.   MRN: [id number]  HPI  64 year old Male for follow-up on diabetes mellitus and weight loss efforts. He has had 18 pound weight loss after Nutritional counseling. Trying to exercise and eat better. Unfortunately, wife has been diagnosed with metastatic breast cancer. She receiving radiation treatments. This is quite stressful for him. Offered antianxiety pain medication but he doesn't want to take any at the present time. Recently had Prevnar and flu vaccine. Blood pressure was initially elevated upon arrival but normalized. He is on metformin. He is not a lipid-lowering medication. He takes krill oil. In January triglycerides were 2:30 in her nail within normal limits.   Review of Systems     Objective:   Physical Exam  Skin warm and dry. Chest clear. Cardiac exam regular rate and rhythm normal S1 and S2. Extremities without edema. Affect is slightly anxious. See foot exam.      Assessment & Plan:  Controlled type 2 diabetes mellitus-hemoglobin A1c days to be added. Continue metformin.  Hyperlipidemia-lipid panel  is entirely within normal limits. He takes krill oil.  Situational stress due to wife's illness  Plan: He will return in February for six-month recheck with lipid panel liver functions hemoglobin A1c, blood pressure check and office visit. Continue diet and exercise. Reminded about annual diabetic eye exam.  25 minutes spent with patient

## 2014-12-11 ENCOUNTER — Telehealth: Payer: Self-pay

## 2014-12-11 LAB — HEMOGLOBIN A1C
Hgb A1c MFr Bld: 7.1 % — ABNORMAL HIGH (ref <5.7)
Mean Plasma Glucose: 157 mg/dL — ABNORMAL HIGH (ref <117)

## 2014-12-11 NOTE — Telephone Encounter (Signed)
-----   Message from Elby Showers, MD sent at 12/11/2014 10:43 AM EDT ----- Please call pt. Hgb AIC 7.1 % and was 7.8 % 3 months ago and 7.1% 9 months ago. Keep up the good work!

## 2014-12-11 NOTE — Telephone Encounter (Signed)
Patient aware of results and recommendations. °

## 2015-02-03 ENCOUNTER — Other Ambulatory Visit: Payer: Self-pay | Admitting: Internal Medicine

## 2015-02-26 DIAGNOSIS — M9901 Segmental and somatic dysfunction of cervical region: Secondary | ICD-10-CM | POA: Diagnosis not present

## 2015-02-26 DIAGNOSIS — M50222 Other cervical disc displacement at C5-C6 level: Secondary | ICD-10-CM | POA: Diagnosis not present

## 2015-02-26 DIAGNOSIS — M9900 Segmental and somatic dysfunction of head region: Secondary | ICD-10-CM | POA: Diagnosis not present

## 2015-02-26 DIAGNOSIS — M5116 Intervertebral disc disorders with radiculopathy, lumbar region: Secondary | ICD-10-CM | POA: Diagnosis not present

## 2015-02-26 DIAGNOSIS — M9903 Segmental and somatic dysfunction of lumbar region: Secondary | ICD-10-CM | POA: Diagnosis not present

## 2015-02-26 DIAGNOSIS — M5020 Other cervical disc displacement, unspecified cervical region: Secondary | ICD-10-CM | POA: Diagnosis not present

## 2015-03-26 DIAGNOSIS — M9901 Segmental and somatic dysfunction of cervical region: Secondary | ICD-10-CM | POA: Diagnosis not present

## 2015-03-26 DIAGNOSIS — M9903 Segmental and somatic dysfunction of lumbar region: Secondary | ICD-10-CM | POA: Diagnosis not present

## 2015-03-26 DIAGNOSIS — M5020 Other cervical disc displacement, unspecified cervical region: Secondary | ICD-10-CM | POA: Diagnosis not present

## 2015-03-26 DIAGNOSIS — M50222 Other cervical disc displacement at C5-C6 level: Secondary | ICD-10-CM | POA: Diagnosis not present

## 2015-03-26 DIAGNOSIS — M5116 Intervertebral disc disorders with radiculopathy, lumbar region: Secondary | ICD-10-CM | POA: Diagnosis not present

## 2015-03-26 DIAGNOSIS — M9900 Segmental and somatic dysfunction of head region: Secondary | ICD-10-CM | POA: Diagnosis not present

## 2015-04-07 ENCOUNTER — Other Ambulatory Visit: Payer: Medicare Other | Admitting: Internal Medicine

## 2015-04-07 DIAGNOSIS — Z79899 Other long term (current) drug therapy: Secondary | ICD-10-CM | POA: Diagnosis not present

## 2015-04-07 DIAGNOSIS — E7801 Familial hypercholesterolemia: Secondary | ICD-10-CM | POA: Diagnosis not present

## 2015-04-07 DIAGNOSIS — E119 Type 2 diabetes mellitus without complications: Secondary | ICD-10-CM | POA: Diagnosis not present

## 2015-04-07 DIAGNOSIS — E781 Pure hyperglyceridemia: Secondary | ICD-10-CM | POA: Diagnosis not present

## 2015-04-07 LAB — HEPATIC FUNCTION PANEL
ALT: 18 U/L (ref 9–46)
AST: 27 U/L (ref 10–35)
Albumin: 4.3 g/dL (ref 3.6–5.1)
Alkaline Phosphatase: 58 U/L (ref 40–115)
BILIRUBIN DIRECT: 0.1 mg/dL (ref <=0.2)
BILIRUBIN TOTAL: 0.6 mg/dL (ref 0.2–1.2)
Indirect Bilirubin: 0.5 mg/dL (ref 0.2–1.2)
Total Protein: 7 g/dL (ref 6.1–8.1)

## 2015-04-07 LAB — LIPID PANEL
Cholesterol: 135 mg/dL (ref 125–200)
HDL: 37 mg/dL — ABNORMAL LOW (ref >=40)
LDL CALC: 81 mg/dL (ref <130)
TRIGLYCERIDES: 84 mg/dL (ref <150)
Total CHOL/HDL Ratio: 3.6 Ratio (ref <=5.0)
VLDL: 17 mg/dL (ref <30)

## 2015-04-07 LAB — HEMOGLOBIN A1C
HEMOGLOBIN A1C: 6.7 % — AB (ref <5.7)
Mean Plasma Glucose: 146 mg/dL — ABNORMAL HIGH (ref <117)

## 2015-04-08 ENCOUNTER — Ambulatory Visit: Payer: Medicare Other | Admitting: Internal Medicine

## 2015-04-22 ENCOUNTER — Encounter: Payer: Self-pay | Admitting: Internal Medicine

## 2015-04-22 ENCOUNTER — Ambulatory Visit (INDEPENDENT_AMBULATORY_CARE_PROVIDER_SITE_OTHER): Payer: Medicare Other | Admitting: Internal Medicine

## 2015-04-22 VITALS — BP 154/84 | HR 66 | Temp 98.0°F | Resp 20 | Ht 72.0 in | Wt 218.0 lb

## 2015-04-22 DIAGNOSIS — E781 Pure hyperglyceridemia: Secondary | ICD-10-CM

## 2015-04-22 DIAGNOSIS — E119 Type 2 diabetes mellitus without complications: Secondary | ICD-10-CM

## 2015-04-22 DIAGNOSIS — Z658 Other specified problems related to psychosocial circumstances: Secondary | ICD-10-CM

## 2015-04-22 DIAGNOSIS — F439 Reaction to severe stress, unspecified: Secondary | ICD-10-CM

## 2015-04-22 DIAGNOSIS — R030 Elevated blood-pressure reading, without diagnosis of hypertension: Secondary | ICD-10-CM

## 2015-04-22 DIAGNOSIS — IMO0001 Reserved for inherently not codable concepts without codable children: Secondary | ICD-10-CM

## 2015-04-22 DIAGNOSIS — F411 Generalized anxiety disorder: Secondary | ICD-10-CM

## 2015-04-22 NOTE — Patient Instructions (Signed)
I am pleased with lab results today. Hemoglobin A1c is excellent 6.7%. Continue to monitor blood pressure at home. Return in 6 months for physical exam. Call if you need antianxiety medication with wife's illness. Continue diet and exercise regimen.

## 2015-04-22 NOTE — Progress Notes (Signed)
   Subjective:    Patient ID: Steven Wheeler, male    DOB: [birth year], 65 y.o.   MRN: [id number]  HPI He is here today to follow-up on essential hypertension, hyperlipidemia, controlled tight 2 diabetes mellitus, metabolic syndrome, obesity. His weight is stable at 216 pounds. Blood pressure slightly elevated today due to stress with wife's illness. Generally running in the 1:30 range she says. He is walking 3 miles every day. He had flu vaccine. Has not had any illnesses this 1 TURP.  Wife has metastatic breast cancer and is under the care of Dr. Jana Hakim. She is doing much better after receiving chemotherapy. They are exercising together at [facility] in [place]. Some night she doesn't sleep well. He has to take his dog out in the middle of the night to urinate. He doesn't want anxiety medication at this point in time.  Lipid panel is normal with exception of low HDL of 37. She takes [employer]. He is not on statin therapy. Hemoglobin A1c is 6.7% and previously was 7.1%. He takes metformin. Issues with elevated blood pressure related anxiety and office hypertension.  Is to have diabetic eye exam in March  Review of Systems see above     Objective:   Physical Exam  Skin warm and dry. Nodes none. Neck is supple without JVD thyromegaly or carotid bruits. Chest clear to auscultation. Cardiac exam regular rate and rhythm. Extremities without edema.      Assessment & Plan:  Elevated blood pressure-continue to monitor. Has element of office hypertension and has anxiety related to wife's illness  Anxiety-does not want antianxiety medication at this point in time  Low HDL cholesterol-37  History of hyperlipidemia takes Krill oil. Lipid panel normal. In January 2016 triglycerides were 230. He lost 18 pounds after nutritional counseling. His weight is state stable during the holidays.  Controlled type 2 diabetes mellitus-pleased with his A1c of 6.7%

## 2015-04-23 DIAGNOSIS — M5116 Intervertebral disc disorders with radiculopathy, lumbar region: Secondary | ICD-10-CM | POA: Diagnosis not present

## 2015-04-23 DIAGNOSIS — M9900 Segmental and somatic dysfunction of head region: Secondary | ICD-10-CM | POA: Diagnosis not present

## 2015-04-23 DIAGNOSIS — M9901 Segmental and somatic dysfunction of cervical region: Secondary | ICD-10-CM | POA: Diagnosis not present

## 2015-04-23 DIAGNOSIS — M9903 Segmental and somatic dysfunction of lumbar region: Secondary | ICD-10-CM | POA: Diagnosis not present

## 2015-04-23 DIAGNOSIS — M50222 Other cervical disc displacement at C5-C6 level: Secondary | ICD-10-CM | POA: Diagnosis not present

## 2015-04-23 DIAGNOSIS — M5020 Other cervical disc displacement, unspecified cervical region: Secondary | ICD-10-CM | POA: Diagnosis not present

## 2015-05-22 ENCOUNTER — Other Ambulatory Visit: Payer: Self-pay | Admitting: Internal Medicine

## 2015-05-27 DIAGNOSIS — M5020 Other cervical disc displacement, unspecified cervical region: Secondary | ICD-10-CM | POA: Diagnosis not present

## 2015-05-27 DIAGNOSIS — M9901 Segmental and somatic dysfunction of cervical region: Secondary | ICD-10-CM | POA: Diagnosis not present

## 2015-05-27 DIAGNOSIS — M9903 Segmental and somatic dysfunction of lumbar region: Secondary | ICD-10-CM | POA: Diagnosis not present

## 2015-05-27 DIAGNOSIS — M50222 Other cervical disc displacement at C5-C6 level: Secondary | ICD-10-CM | POA: Diagnosis not present

## 2015-05-27 DIAGNOSIS — M5116 Intervertebral disc disorders with radiculopathy, lumbar region: Secondary | ICD-10-CM | POA: Diagnosis not present

## 2015-05-27 DIAGNOSIS — M9900 Segmental and somatic dysfunction of head region: Secondary | ICD-10-CM | POA: Diagnosis not present

## 2015-06-24 DIAGNOSIS — M5020 Other cervical disc displacement, unspecified cervical region: Secondary | ICD-10-CM | POA: Diagnosis not present

## 2015-06-24 DIAGNOSIS — M9903 Segmental and somatic dysfunction of lumbar region: Secondary | ICD-10-CM | POA: Diagnosis not present

## 2015-06-24 DIAGNOSIS — M9901 Segmental and somatic dysfunction of cervical region: Secondary | ICD-10-CM | POA: Diagnosis not present

## 2015-06-24 DIAGNOSIS — M5116 Intervertebral disc disorders with radiculopathy, lumbar region: Secondary | ICD-10-CM | POA: Diagnosis not present

## 2015-06-24 DIAGNOSIS — M9900 Segmental and somatic dysfunction of head region: Secondary | ICD-10-CM | POA: Diagnosis not present

## 2015-06-24 DIAGNOSIS — M50222 Other cervical disc displacement at C5-C6 level: Secondary | ICD-10-CM | POA: Diagnosis not present

## 2015-07-29 DIAGNOSIS — M9901 Segmental and somatic dysfunction of cervical region: Secondary | ICD-10-CM | POA: Diagnosis not present

## 2015-07-29 DIAGNOSIS — M9900 Segmental and somatic dysfunction of head region: Secondary | ICD-10-CM | POA: Diagnosis not present

## 2015-07-29 DIAGNOSIS — M5020 Other cervical disc displacement, unspecified cervical region: Secondary | ICD-10-CM | POA: Diagnosis not present

## 2015-07-29 DIAGNOSIS — M9903 Segmental and somatic dysfunction of lumbar region: Secondary | ICD-10-CM | POA: Diagnosis not present

## 2015-07-29 DIAGNOSIS — M5116 Intervertebral disc disorders with radiculopathy, lumbar region: Secondary | ICD-10-CM | POA: Diagnosis not present

## 2015-07-29 DIAGNOSIS — M50222 Other cervical disc displacement at C5-C6 level: Secondary | ICD-10-CM | POA: Diagnosis not present

## 2015-09-03 DIAGNOSIS — M9901 Segmental and somatic dysfunction of cervical region: Secondary | ICD-10-CM | POA: Diagnosis not present

## 2015-09-03 DIAGNOSIS — M50222 Other cervical disc displacement at C5-C6 level: Secondary | ICD-10-CM | POA: Diagnosis not present

## 2015-09-03 DIAGNOSIS — M9900 Segmental and somatic dysfunction of head region: Secondary | ICD-10-CM | POA: Diagnosis not present

## 2015-09-03 DIAGNOSIS — M5116 Intervertebral disc disorders with radiculopathy, lumbar region: Secondary | ICD-10-CM | POA: Diagnosis not present

## 2015-09-03 DIAGNOSIS — M9903 Segmental and somatic dysfunction of lumbar region: Secondary | ICD-10-CM | POA: Diagnosis not present

## 2015-09-03 DIAGNOSIS — M5020 Other cervical disc displacement, unspecified cervical region: Secondary | ICD-10-CM | POA: Diagnosis not present

## 2015-09-10 DIAGNOSIS — H40021 Open angle with borderline findings, high risk, right eye: Secondary | ICD-10-CM | POA: Diagnosis not present

## 2015-09-11 ENCOUNTER — Other Ambulatory Visit: Payer: Self-pay | Admitting: Internal Medicine

## 2015-10-01 DIAGNOSIS — M9900 Segmental and somatic dysfunction of head region: Secondary | ICD-10-CM | POA: Diagnosis not present

## 2015-10-01 DIAGNOSIS — M9901 Segmental and somatic dysfunction of cervical region: Secondary | ICD-10-CM | POA: Diagnosis not present

## 2015-10-01 DIAGNOSIS — M5020 Other cervical disc displacement, unspecified cervical region: Secondary | ICD-10-CM | POA: Diagnosis not present

## 2015-10-01 DIAGNOSIS — M5116 Intervertebral disc disorders with radiculopathy, lumbar region: Secondary | ICD-10-CM | POA: Diagnosis not present

## 2015-10-01 DIAGNOSIS — M9903 Segmental and somatic dysfunction of lumbar region: Secondary | ICD-10-CM | POA: Diagnosis not present

## 2015-10-01 DIAGNOSIS — M50222 Other cervical disc displacement at C5-C6 level: Secondary | ICD-10-CM | POA: Diagnosis not present

## 2015-10-28 ENCOUNTER — Other Ambulatory Visit: Payer: Medicare Other | Admitting: Internal Medicine

## 2015-10-28 DIAGNOSIS — R030 Elevated blood-pressure reading, without diagnosis of hypertension: Secondary | ICD-10-CM | POA: Diagnosis not present

## 2015-10-28 DIAGNOSIS — E119 Type 2 diabetes mellitus without complications: Secondary | ICD-10-CM

## 2015-10-28 DIAGNOSIS — E781 Pure hyperglyceridemia: Secondary | ICD-10-CM | POA: Diagnosis not present

## 2015-10-28 DIAGNOSIS — E669 Obesity, unspecified: Secondary | ICD-10-CM

## 2015-10-28 DIAGNOSIS — E8881 Metabolic syndrome: Secondary | ICD-10-CM

## 2015-10-28 LAB — COMPLETE METABOLIC PANEL WITH GFR
ALK PHOS: 60 U/L (ref 40–115)
ALT: 16 U/L (ref 9–46)
AST: 23 U/L (ref 10–35)
Albumin: 4.3 g/dL (ref 3.6–5.1)
BUN: 17 mg/dL (ref 7–25)
CHLORIDE: 106 mmol/L (ref 98–110)
CO2: 27 mmol/L (ref 20–31)
CREATININE: 0.97 mg/dL (ref 0.70–1.25)
Calcium: 9.1 mg/dL (ref 8.6–10.3)
GFR, Est Non African American: 81 mL/min (ref >=60)
GLUCOSE: 156 mg/dL — AB (ref 65–99)
Potassium: 4.9 mmol/L (ref 3.5–5.3)
SODIUM: 141 mmol/L (ref 135–146)
Total Bilirubin: 0.7 mg/dL (ref 0.2–1.2)
Total Protein: 6.8 g/dL (ref 6.1–8.1)

## 2015-10-28 LAB — LIPID PANEL
CHOL/HDL RATIO: 3.4 ratio (ref <=5.0)
CHOLESTEROL: 139 mg/dL (ref 125–200)
HDL: 41 mg/dL (ref >=40)
LDL Cholesterol: 75 mg/dL (ref <130)
Triglycerides: 116 mg/dL (ref <150)
VLDL: 23 mg/dL (ref <30)

## 2015-10-28 LAB — CBC WITH DIFFERENTIAL/PLATELET
BASOS PCT: 1 %
Basophils Absolute: 62 cells/uL (ref 0–200)
EOS PCT: 4 %
Eosinophils Absolute: 248 cells/uL (ref 15–500)
HCT: 44.7 % (ref 38.5–50.0)
Hemoglobin: 14.8 g/dL (ref 13.2–17.1)
LYMPHS PCT: 37 %
Lymphs Abs: 2294 cells/uL (ref 850–3900)
MCH: 29 pg (ref 27.0–33.0)
MCHC: 33.1 g/dL (ref 32.0–36.0)
MCV: 87.6 fL (ref 80.0–100.0)
MONOS PCT: 11 %
MPV: 9.9 fL (ref 7.5–12.5)
Monocytes Absolute: 682 cells/uL (ref 200–950)
NEUTROS ABS: 2914 {cells}/uL (ref 1500–7800)
Neutrophils Relative %: 47 %
PLATELETS: 215 10*3/uL (ref 140–400)
RBC: 5.1 MIL/uL (ref 4.20–5.80)
RDW: 13.1 % (ref 11.0–15.0)
WBC: 6.2 10*3/uL (ref 3.8–10.8)

## 2015-10-28 LAB — PSA: PSA: 0.5 ng/mL (ref <=4.0)

## 2015-10-29 LAB — MICROALBUMIN / CREATININE URINE RATIO
CREATININE, URINE: 219 mg/dL (ref 20–370)
MICROALB UR: 0.3 mg/dL
MICROALB/CREAT RATIO: 1 ug/mg{creat} (ref <30)

## 2015-10-29 LAB — HEMOGLOBIN A1C
HEMOGLOBIN A1C: 6.9 % — AB (ref <5.7)
Mean Plasma Glucose: 151 mg/dL

## 2015-10-30 ENCOUNTER — Ambulatory Visit (INDEPENDENT_AMBULATORY_CARE_PROVIDER_SITE_OTHER): Payer: Medicare Other | Admitting: Internal Medicine

## 2015-10-30 ENCOUNTER — Encounter: Payer: Self-pay | Admitting: Internal Medicine

## 2015-10-30 VITALS — BP 136/82 | HR 73 | Temp 98.0°F | Ht 72.0 in | Wt 221.0 lb

## 2015-10-30 DIAGNOSIS — F439 Reaction to severe stress, unspecified: Secondary | ICD-10-CM

## 2015-10-30 DIAGNOSIS — Z0000 Encounter for general adult medical examination without abnormal findings: Secondary | ICD-10-CM | POA: Diagnosis not present

## 2015-10-30 DIAGNOSIS — Z23 Encounter for immunization: Secondary | ICD-10-CM

## 2015-10-30 DIAGNOSIS — E785 Hyperlipidemia, unspecified: Secondary | ICD-10-CM

## 2015-10-30 DIAGNOSIS — N529 Male erectile dysfunction, unspecified: Secondary | ICD-10-CM

## 2015-10-30 DIAGNOSIS — E119 Type 2 diabetes mellitus without complications: Secondary | ICD-10-CM

## 2015-10-30 DIAGNOSIS — Z658 Other specified problems related to psychosocial circumstances: Secondary | ICD-10-CM

## 2015-10-30 MED ORDER — METFORMIN HCL 500 MG PO TABS: 500.0000 mg | ORAL_TABLET | Freq: Two times a day (BID) | ORAL | 3 refills | Status: DC

## 2015-10-30 NOTE — Progress Notes (Signed)
Subjective:    Patient ID: Steven Wheeler, male    DOB: 11-24-1950, 65 y.o.   MRN: [id number]  HPI  65 year old  White Male  for health maintenance exam and evaluation of medical issues.He is on metformin twice daily for Type 2 diabetes mellitus. Hemoglobin A1c was 7.1%, subsequently went down to 6.7% and is now 6.9%.  His wife has metastatic breast cancer. He is dealing well with this. He feels fine. He needs to lose some weight.  Past medical history: History of fractured left arm in age 31. History of hepatitis a around 39.  Annual eye exam by Dr. Sherlean Foot each fall.  Virtual colonoscopy in May 2009.  Pneumovax in is a 2010. Tetanus immunization 2004.  Wisdom teeth extracted 4 days 29.  Social history: Nonsmoker, social alcohol consumption. Married. Wife is a retired Pharmacist, hospital. 2 adult daughters. He is a retired Water engineer.  Family history: Father died of an MI. Mother age 52 with history of dementia and congestive heart failure as well as vision loss in. One brother in good health. 3 sisters. One sister died of cancer. 2 sisters living one of them has hypertension.      Review of Systems  Constitutional: Negative.   All other systems reviewed and are negative.      Objective:   Physical Exam  Constitutional: He is oriented to person, place, and time. He appears well-developed and well-nourished. No distress.  HENT:  Head: Normocephalic and atraumatic.  Right Ear: External ear normal.  Left Ear: External ear normal.  Mouth/Throat: Oropharynx is clear and moist.  Eyes: Conjunctivae and EOM are normal. Pupils are equal, round, and reactive to light. Right eye exhibits no discharge. Left eye exhibits no discharge. No scleral icterus.  Neck: Neck supple. No JVD present. No thyromegaly present.  Cardiovascular: Normal rate, regular rhythm, normal heart sounds and intact distal pulses.   Pulmonary/Chest: He has no wheezes. He has no rales.  Abdominal: Soft. Bowel  sounds are normal. He exhibits no distension and no mass. There is no tenderness. There is no rebound and no guarding.  Genitourinary: Prostate normal.  Lymphadenopathy:    He has no cervical adenopathy.  Neurological: He is alert and oriented to person, place, and time. He has normal reflexes. No cranial nerve deficit. Coordination normal.  Skin: Skin is warm and dry. No rash noted. He is not diaphoretic.  Psychiatric: He has a normal mood and affect. His behavior is normal. Judgment and thought content normal.  Vitals reviewed.         Assessment & Plan:  Normal health maintenance exam  History of hypertriglyceridemia. Takes krill oil. Lipid panel normal. Does not want to be on statin.  Controlled type 2 diabetes mellitus-treated with metformin. Hemoglobin A1c has increased from 6.7-6.9%  Obesity-weight is 221 pounds. Needs to weigh closer to 200 pounds  History of erectile dysfunction  Borderline hypertension-blood pressure 136/82. Continue to monitor.  Situational stress due to wife's metastatic breast cancer doing well handling this  Plan: Continue diet exercise and weight loss efforts. Return in 6 months. Flu Vaccine given. Continue same medications  Subjective:   Patient presents for Medicare Annual/Subsequent preventive examination.  Review Past Medical/Family/Social:See above   Risk Factors  Current exercise habits: Exercises at Gym Dietary issues discussed: Low fat low carbohydrate  Cardiac risk factors: Diabetes and hyperlipidemia  Depression Screen  (Note: if answer to either of the following is "Yes", a more complete depression screening is indicated)  Over the past two weeks, have you felt down, depressed or hopeless? No  Over the past two weeks, have you felt little interest or pleasure in doing things? No Have you lost interest or pleasure in daily life? No Do you often feel hopeless? No Do you cry easily over simple problems? No   Activities of  Daily Living  In your present state of health, do you have any difficulty performing the following activities?:   Driving? No  Managing money? No  Feeding yourself? No  Getting from bed to chair? No  Climbing a flight of stairs? No  Preparing food and eating?: No  Bathing or showering? No  Getting dressed: No  Getting to the toilet? No  Using the toilet:No  Moving around from place to place: No  In the past year have you fallen or had a near fall?:No  Are you sexually active? yes Do you have more than one partner? No   Hearing Difficulties: No  Do you often ask people to speak up or repeat themselves? No  Do you experience ringing or noises in your ears? No  Do you have difficulty understanding soft or whispered voices? No  Do you feel that you have a problem with memory? No Do you often misplace items? No    Home Safety:  Do you have a smoke alarm at your residence? Yes Do you have grab bars in the bathroom? No Do you have throw rugs in your house? Yes   Cognitive Testing  Alert? Yes Normal Appearance?Yes  Oriented to person? Yes Place? Yes  Time? Yes  Recall of three objects? Yes  Can perform simple calculations? Yes  Displays appropriate judgment?Yes  Can read the correct time from a watch face?Yes   List the Names of Other Physician/Practitioners you currently use:  See referral list for the physicians patient is currently seeing.     Review of Systems: See above   Objective:     General appearance: Appears stated age and mildly obese  Head: Normocephalic, without obvious abnormality, atraumatic  Eyes: conj clear, EOMi PEERLA  Ears: normal TM's and external ear canals both ears  Nose: Nares normal. Septum midline. Mucosa normal. No drainage or sinus tenderness.  Throat: lips, mucosa, and tongue normal; teeth and gums normal  Neck: no adenopathy, no carotid bruit, no JVD, supple, symmetrical, trachea midline and thyroid not enlarged, symmetric, no  tenderness/mass/nodules  No CVA tenderness.  Lungs: clear to auscultation bilaterally  Breasts: normal appearance, no masses or tenderness,  Heart: regular rate and rhythm, S1, S2 normal, no murmur, click, rub or gallop  Abdomen: soft, non-tender; bowel sounds normal; no masses, no organomegaly  Musculoskeletal: ROM normal in all joints, no crepitus, no deformity, Normal muscle strengthen. Back  is symmetric, no curvature. Skin: Skin color, texture, turgor normal. No rashes or lesions  Lymph nodes: Cervical, supraclavicular, and axillary nodes normal.  Neurologic: CN 2 -12 Normal, Normal symmetric reflexes. Normal coordination and gait  Psych: Alert & Oriented x 3, Mood appear stable.    Assessment:    Annual wellness medicare exam   Plan:    During the course of the visit the patient was educated and counseled about appropriate screening and preventive services including:   Annual flu vaccine     Patient Instructions (the written plan) was given to the patient.  Medicare Attestation  I have personally reviewed:  The patient's medical and social history  Their use of alcohol, tobacco or illicit drugs  Their current medications and supplements  The patient's functional ability including ADLs,fall risks, home safety risks, cognitive, and hearing and visual impairment  Diet and physical activities  Evidence for depression or mood disorders  The patient's weight, height, BMI, and visual acuity have been recorded in the chart. I have made referrals, counseling, and provided education to the patient based on review of the above and I have provided the patient with a written personalized care plan for preventive services.

## 2015-11-05 DIAGNOSIS — M50222 Other cervical disc displacement at C5-C6 level: Secondary | ICD-10-CM | POA: Diagnosis not present

## 2015-11-05 DIAGNOSIS — M5020 Other cervical disc displacement, unspecified cervical region: Secondary | ICD-10-CM | POA: Diagnosis not present

## 2015-11-05 DIAGNOSIS — M9903 Segmental and somatic dysfunction of lumbar region: Secondary | ICD-10-CM | POA: Diagnosis not present

## 2015-11-05 DIAGNOSIS — M9901 Segmental and somatic dysfunction of cervical region: Secondary | ICD-10-CM | POA: Diagnosis not present

## 2015-11-05 DIAGNOSIS — M5116 Intervertebral disc disorders with radiculopathy, lumbar region: Secondary | ICD-10-CM | POA: Diagnosis not present

## 2015-11-05 DIAGNOSIS — M9900 Segmental and somatic dysfunction of head region: Secondary | ICD-10-CM | POA: Diagnosis not present

## 2015-11-06 DIAGNOSIS — M9901 Segmental and somatic dysfunction of cervical region: Secondary | ICD-10-CM | POA: Diagnosis not present

## 2015-11-06 DIAGNOSIS — M9903 Segmental and somatic dysfunction of lumbar region: Secondary | ICD-10-CM | POA: Diagnosis not present

## 2015-11-06 DIAGNOSIS — M5020 Other cervical disc displacement, unspecified cervical region: Secondary | ICD-10-CM | POA: Diagnosis not present

## 2015-11-06 DIAGNOSIS — M50222 Other cervical disc displacement at C5-C6 level: Secondary | ICD-10-CM | POA: Diagnosis not present

## 2015-11-06 DIAGNOSIS — M5116 Intervertebral disc disorders with radiculopathy, lumbar region: Secondary | ICD-10-CM | POA: Diagnosis not present

## 2015-11-06 DIAGNOSIS — M9900 Segmental and somatic dysfunction of head region: Secondary | ICD-10-CM | POA: Diagnosis not present

## 2015-11-18 DIAGNOSIS — M5116 Intervertebral disc disorders with radiculopathy, lumbar region: Secondary | ICD-10-CM | POA: Diagnosis not present

## 2015-11-18 DIAGNOSIS — M9903 Segmental and somatic dysfunction of lumbar region: Secondary | ICD-10-CM | POA: Diagnosis not present

## 2015-11-18 DIAGNOSIS — M9901 Segmental and somatic dysfunction of cervical region: Secondary | ICD-10-CM | POA: Diagnosis not present

## 2015-11-18 DIAGNOSIS — M5020 Other cervical disc displacement, unspecified cervical region: Secondary | ICD-10-CM | POA: Diagnosis not present

## 2015-11-18 DIAGNOSIS — M9900 Segmental and somatic dysfunction of head region: Secondary | ICD-10-CM | POA: Diagnosis not present

## 2015-11-18 DIAGNOSIS — M50222 Other cervical disc displacement at C5-C6 level: Secondary | ICD-10-CM | POA: Diagnosis not present

## 2015-11-22 ENCOUNTER — Encounter: Payer: Self-pay | Admitting: Internal Medicine

## 2015-11-22 NOTE — Patient Instructions (Addendum)
It was a pleasure to see you today.  Continue same medications and return in 6 months.  Flu vaccine given. 

## 2015-12-15 DIAGNOSIS — M9903 Segmental and somatic dysfunction of lumbar region: Secondary | ICD-10-CM | POA: Diagnosis not present

## 2015-12-15 DIAGNOSIS — M9900 Segmental and somatic dysfunction of head region: Secondary | ICD-10-CM | POA: Diagnosis not present

## 2015-12-15 DIAGNOSIS — M5116 Intervertebral disc disorders with radiculopathy, lumbar region: Secondary | ICD-10-CM | POA: Diagnosis not present

## 2015-12-15 DIAGNOSIS — M9901 Segmental and somatic dysfunction of cervical region: Secondary | ICD-10-CM | POA: Diagnosis not present

## 2015-12-15 DIAGNOSIS — M50222 Other cervical disc displacement at C5-C6 level: Secondary | ICD-10-CM | POA: Diagnosis not present

## 2015-12-15 DIAGNOSIS — M5020 Other cervical disc displacement, unspecified cervical region: Secondary | ICD-10-CM | POA: Diagnosis not present

## 2016-02-01 DIAGNOSIS — M9900 Segmental and somatic dysfunction of head region: Secondary | ICD-10-CM | POA: Diagnosis not present

## 2016-02-01 DIAGNOSIS — M50222 Other cervical disc displacement at C5-C6 level: Secondary | ICD-10-CM | POA: Diagnosis not present

## 2016-02-01 DIAGNOSIS — M5116 Intervertebral disc disorders with radiculopathy, lumbar region: Secondary | ICD-10-CM | POA: Diagnosis not present

## 2016-02-01 DIAGNOSIS — M9903 Segmental and somatic dysfunction of lumbar region: Secondary | ICD-10-CM | POA: Diagnosis not present

## 2016-02-01 DIAGNOSIS — M5020 Other cervical disc displacement, unspecified cervical region: Secondary | ICD-10-CM | POA: Diagnosis not present

## 2016-02-01 DIAGNOSIS — M9901 Segmental and somatic dysfunction of cervical region: Secondary | ICD-10-CM | POA: Diagnosis not present

## 2016-02-24 DIAGNOSIS — M9900 Segmental and somatic dysfunction of head region: Secondary | ICD-10-CM | POA: Diagnosis not present

## 2016-02-24 DIAGNOSIS — M9901 Segmental and somatic dysfunction of cervical region: Secondary | ICD-10-CM | POA: Diagnosis not present

## 2016-02-24 DIAGNOSIS — M5020 Other cervical disc displacement, unspecified cervical region: Secondary | ICD-10-CM | POA: Diagnosis not present

## 2016-02-24 DIAGNOSIS — M50222 Other cervical disc displacement at C5-C6 level: Secondary | ICD-10-CM | POA: Diagnosis not present

## 2016-02-24 DIAGNOSIS — M9903 Segmental and somatic dysfunction of lumbar region: Secondary | ICD-10-CM | POA: Diagnosis not present

## 2016-02-24 DIAGNOSIS — M5116 Intervertebral disc disorders with radiculopathy, lumbar region: Secondary | ICD-10-CM | POA: Diagnosis not present

## 2016-03-30 DIAGNOSIS — M9903 Segmental and somatic dysfunction of lumbar region: Secondary | ICD-10-CM | POA: Diagnosis not present

## 2016-03-30 DIAGNOSIS — M9900 Segmental and somatic dysfunction of head region: Secondary | ICD-10-CM | POA: Diagnosis not present

## 2016-03-30 DIAGNOSIS — M50222 Other cervical disc displacement at C5-C6 level: Secondary | ICD-10-CM | POA: Diagnosis not present

## 2016-03-30 DIAGNOSIS — M9901 Segmental and somatic dysfunction of cervical region: Secondary | ICD-10-CM | POA: Diagnosis not present

## 2016-03-30 DIAGNOSIS — M5116 Intervertebral disc disorders with radiculopathy, lumbar region: Secondary | ICD-10-CM | POA: Diagnosis not present

## 2016-03-30 DIAGNOSIS — M5020 Other cervical disc displacement, unspecified cervical region: Secondary | ICD-10-CM | POA: Diagnosis not present

## 2016-04-27 ENCOUNTER — Other Ambulatory Visit: Payer: Medicare Other | Admitting: Internal Medicine

## 2016-04-27 DIAGNOSIS — E781 Pure hyperglyceridemia: Secondary | ICD-10-CM | POA: Diagnosis not present

## 2016-04-27 DIAGNOSIS — E119 Type 2 diabetes mellitus without complications: Secondary | ICD-10-CM

## 2016-04-27 LAB — LIPID PANEL
CHOL/HDL RATIO: 3.8 ratio (ref <5.0)
Cholesterol: 139 mg/dL (ref <200)
HDL: 37 mg/dL — AB (ref >40)
LDL CALC: 83 mg/dL (ref <100)
Triglycerides: 94 mg/dL (ref <150)
VLDL: 19 mg/dL (ref <30)

## 2016-04-27 LAB — MICROALBUMIN / CREATININE URINE RATIO
Creatinine, Urine: 176 mg/dL (ref 20–370)
MICROALB/CREAT RATIO: 1 ug/mg{creat} (ref <30)
Microalb, Ur: 0.2 mg/dL

## 2016-04-28 LAB — HEMOGLOBIN A1C
HEMOGLOBIN A1C: 7 % — AB (ref <5.7)
MEAN PLASMA GLUCOSE: 154 mg/dL

## 2016-04-29 ENCOUNTER — Encounter: Payer: Self-pay | Admitting: Internal Medicine

## 2016-04-29 ENCOUNTER — Ambulatory Visit (INDEPENDENT_AMBULATORY_CARE_PROVIDER_SITE_OTHER): Payer: Medicare Other | Admitting: Internal Medicine

## 2016-04-29 VITALS — BP 130/88 | HR 72 | Ht 72.0 in | Wt 226.0 lb

## 2016-04-29 DIAGNOSIS — E7849 Other hyperlipidemia: Secondary | ICD-10-CM

## 2016-04-29 DIAGNOSIS — E119 Type 2 diabetes mellitus without complications: Secondary | ICD-10-CM

## 2016-04-29 DIAGNOSIS — F439 Reaction to severe stress, unspecified: Secondary | ICD-10-CM | POA: Diagnosis not present

## 2016-04-29 DIAGNOSIS — R030 Elevated blood-pressure reading, without diagnosis of hypertension: Secondary | ICD-10-CM

## 2016-04-29 DIAGNOSIS — E784 Other hyperlipidemia: Secondary | ICD-10-CM

## 2016-05-15 NOTE — Progress Notes (Signed)
   Subjective:    Patient ID: Steven Wheeler, male    DOB: 1950/11/17, 66 y.o.   MRN: [id number]  HPI  66 year old Male with history of diabetes mellitus for six-month follow-up. He has a history of hyperlipidemia. He takes metformin for control of his diabetes. He takes Krill oil to control hyperlipidemia.  Immunizations are up-to-date. No new complaints. He feels well.  Wife has metastatic breast cancer. They traveled to [place] recently and had a good time.   Review of Systems see above     Objective:   Physical Exam Skin warm and dry. Nodes none. Neck is supple without JVD thyromegaly or carotid bruits. Chest clear to auscultation. Cardiac exam regular rate and rhythm normal S1 and S2. Extremities without edema. Diastolic blood pressure slightly elevated at 88. Systolic blood pressure 9:02. He is to keep an eye on his blood pressure.       Assessment & Plan:  Controlled type 2 diabetes mellitus-hemoglobin A1c 7% in 6 months ago was 6.9%.  Hyperlipidemia  Borderline hypertension  Plan: Patient is to monitor his blood pressure at home. Would like to have diastolic pressures at 80. He will notify me if blood pressure is persistently elevated.  Plan: Return in 6 months and continue same regimen.

## 2016-05-15 NOTE — Patient Instructions (Signed)
Continue diet and exercise regimen. Return in 6 months.

## 2016-10-29 ENCOUNTER — Other Ambulatory Visit: Payer: Medicare Other | Admitting: Internal Medicine

## 2016-10-29 DIAGNOSIS — E118 Type 2 diabetes mellitus with unspecified complications: Secondary | ICD-10-CM

## 2016-10-29 DIAGNOSIS — Z125 Encounter for screening for malignant neoplasm of prostate: Secondary | ICD-10-CM | POA: Diagnosis not present

## 2016-10-29 DIAGNOSIS — Z0000 Encounter for general adult medical examination without abnormal findings: Secondary | ICD-10-CM | POA: Diagnosis not present

## 2016-10-29 DIAGNOSIS — E781 Pure hyperglyceridemia: Secondary | ICD-10-CM | POA: Diagnosis not present

## 2016-10-29 DIAGNOSIS — Z1159 Encounter for screening for other viral diseases: Secondary | ICD-10-CM | POA: Diagnosis not present

## 2016-10-29 DIAGNOSIS — E669 Obesity, unspecified: Secondary | ICD-10-CM

## 2016-11-02 ENCOUNTER — Ambulatory Visit (INDEPENDENT_AMBULATORY_CARE_PROVIDER_SITE_OTHER): Payer: Medicare Other | Admitting: Internal Medicine

## 2016-11-02 ENCOUNTER — Encounter: Payer: Self-pay | Admitting: Internal Medicine

## 2016-11-02 VITALS — BP 126/82 | HR 87 | Temp 97.5°F | Ht 71.0 in | Wt 232.0 lb

## 2016-11-02 DIAGNOSIS — Z23 Encounter for immunization: Secondary | ICD-10-CM

## 2016-11-02 DIAGNOSIS — T63481A Toxic effect of venom of other arthropod, accidental (unintentional), initial encounter: Secondary | ICD-10-CM | POA: Diagnosis not present

## 2016-11-02 DIAGNOSIS — Z0000 Encounter for general adult medical examination without abnormal findings: Secondary | ICD-10-CM | POA: Diagnosis not present

## 2016-11-02 DIAGNOSIS — E119 Type 2 diabetes mellitus without complications: Secondary | ICD-10-CM | POA: Diagnosis not present

## 2016-11-02 DIAGNOSIS — E786 Lipoprotein deficiency: Secondary | ICD-10-CM

## 2016-11-02 LAB — POCT URINALYSIS DIPSTICK
Bilirubin, UA: NEGATIVE
Glucose, UA: NEGATIVE
Ketones, UA: NEGATIVE
Leukocytes, UA: NEGATIVE
Nitrite, UA: NEGATIVE
PH UA: 6 (ref 5.0–8.0)
PROTEIN UA: NEGATIVE
RBC UA: NEGATIVE
Spec Grav, UA: 1.025 (ref 1.010–1.025)
Urobilinogen, UA: 0.2 E.U./dL

## 2016-11-02 LAB — HEMOGLOBIN A1C
EAG (MMOL/L): 9.8 (calc)
HEMOGLOBIN A1C: 7.8 %{Hb} — AB (ref <5.7)
MEAN PLASMA GLUCOSE: 177 (calc)

## 2016-11-02 LAB — CBC WITH DIFFERENTIAL/PLATELET
BASOS PCT: 1.2 %
Basophils Absolute: 89 cells/uL (ref 0–200)
EOS ABS: 252 {cells}/uL (ref 15–500)
Eosinophils Relative: 3.4 %
HCT: 46 % (ref 38.5–50.0)
HEMOGLOBIN: 15.1 g/dL (ref 13.2–17.1)
Lymphs Abs: 2494 cells/uL (ref 850–3900)
MCH: 28.2 pg (ref 27.0–33.0)
MCHC: 32.8 g/dL (ref 32.0–36.0)
MCV: 86 fL (ref 80.0–100.0)
MONOS PCT: 9.9 %
MPV: 10.2 fL (ref 7.5–12.5)
NEUTROS ABS: 3833 {cells}/uL (ref 1500–7800)
Neutrophils Relative %: 51.8 %
PLATELETS: 222 10*3/uL (ref 140–400)
RBC: 5.35 10*6/uL (ref 4.20–5.80)
RDW: 12 % (ref 11.0–15.0)
TOTAL LYMPHOCYTE: 33.7 %
WBC mixed population: 733 cells/uL (ref 200–950)
WBC: 7.4 10*3/uL (ref 3.8–10.8)

## 2016-11-02 LAB — TEST AUTHORIZATION

## 2016-11-02 LAB — COMPLETE METABOLIC PANEL WITH GFR
AG Ratio: 1.6 (calc) (ref 1.0–2.5)
ALBUMIN MSPROF: 4.2 g/dL (ref 3.6–5.1)
ALT: 19 U/L (ref 9–46)
AST: 27 U/L (ref 10–35)
Alkaline phosphatase (APISO): 62 U/L (ref 40–115)
BILIRUBIN TOTAL: 0.7 mg/dL (ref 0.2–1.2)
BUN: 20 mg/dL (ref 7–25)
CALCIUM: 9.4 mg/dL (ref 8.6–10.3)
CO2: 26 mmol/L (ref 20–32)
Chloride: 102 mmol/L (ref 98–110)
Creat: 0.91 mg/dL (ref 0.70–1.25)
GFR, EST AFRICAN AMERICAN: 101 mL/min/{1.73_m2} (ref >=60)
GFR, Est Non African American: 87 mL/min/{1.73_m2} (ref >=60)
GLUCOSE: 163 mg/dL — AB (ref 65–99)
Globulin: 2.7 g/dL (calc) (ref 1.9–3.7)
Potassium: 4.6 mmol/L (ref 3.5–5.3)
Sodium: 136 mmol/L (ref 135–146)
TOTAL PROTEIN: 6.9 g/dL (ref 6.1–8.1)

## 2016-11-02 LAB — LIPID PANEL
CHOL/HDL RATIO: 4.1 (calc) (ref <5.0)
Cholesterol: 155 mg/dL (ref <200)
HDL: 38 mg/dL — ABNORMAL LOW (ref >40)
LDL CHOLESTEROL (CALC): 92 mg/dL
Non-HDL Cholesterol (Calc): 117 mg/dL (calc) (ref <130)
TRIGLYCERIDES: 148 mg/dL (ref <150)

## 2016-11-02 LAB — MICROALBUMIN / CREATININE URINE RATIO
CREATININE, URINE: 224 mg/dL (ref 20–370)
MICROALB UR: 0.3 mg/dL
Microalb Creat Ratio: 1 mcg/mg creat (ref <30)

## 2016-11-02 LAB — PSA: PSA: 0.7 ng/mL (ref <=4.0)

## 2016-11-02 LAB — HEPATITIS C ANTIBODY
HEP C AB: NONREACTIVE
SIGNAL TO CUT-OFF: 0.01 (ref <1.00)

## 2016-11-02 NOTE — Patient Instructions (Addendum)
It was a pleasure to see you today. We have agreed you will work on diet and exercise. RTC 6 months. Continue same meds. Flu and Pneumovax 23 given. Apply OTC hydrocortisone cream to skin.

## 2016-11-02 NOTE — Progress Notes (Signed)
Subjective:    Patient ID: Steven Wheeler, male    DOB: Oct 12, 1950, 66 y.o.   MRN: [id number]  HPI 66 year old White Male in today for health maintenance exam and evaluation of medical issues. He has several insect stings that occurred when he inadvertently walked through the yard and a nest was on the ground. He had no anaphylactic reaction.  Has a history of controlled type 2 diabetes mellitus without complication. He is on metformin twice daily.  His wife has metastatic breast cancer. He appears to be dealing with this very well. They try to travel frequently.  Past medical history: History of fractured left arm and age 66. History of hepatitis A around 1982.  Annual eye exam each fall.  [facility] will colonoscopy in May 2009.  Pneumococcal vaccines up-to-date. Tetanus immunization 2014. Influenza vaccine given today. Had zoster vaccine 2012.  Wisdom teeth extracted 4 at approximately age 14.  Social history: Married. Wife is a retired Pharmacist, hospital. 2 adult daughters. He is a retired Water engineer. Nonsmoker. Social alcohol consumption.  Family history: Father died of an MI. Mother living in her late 70s with history of dementia and congestive heart failure as well as vision loss in 1. One brother in good health. 3 sisters. One sister died of cancer. 2 sisters living one of whom has hypertension.    Review of Systems  Constitutional: Negative.   Respiratory: Negative.   Cardiovascular: Negative.   Gastrointestinal: Negative.   Genitourinary: Negative.   Psychiatric/Behavioral: Negative.        Objective:   Physical Exam  Constitutional: He is oriented to person, place, and time. He appears well-developed and well-nourished. No distress.  HENT:  Head: Normocephalic and atraumatic.  Right Ear: External ear normal.  Left Ear: External ear normal.  Mouth/Throat: Oropharynx is clear and moist. No oropharyngeal exudate.  Eyes: Pupils are equal, round, and reactive to  light. Conjunctivae and EOM are normal. Right eye exhibits no discharge. Left eye exhibits no discharge.  Neck: Neck supple. No JVD present. No thyromegaly present.  Cardiovascular: Normal rate, regular rhythm, normal heart sounds and intact distal pulses.   No murmur heard. Pulmonary/Chest: Effort normal and breath sounds normal. No respiratory distress. He has no wheezes. He has no rales. He exhibits no tenderness.  Abdominal: Soft. Bowel sounds are normal. He exhibits no distension and no mass. There is no tenderness. There is no rebound and no guarding.  Genitourinary: Prostate normal.  Musculoskeletal: He exhibits no edema.  Lymphadenopathy:    He has no cervical adenopathy.  Neurological: He is alert and oriented to person, place, and time. No cranial nerve deficit. Coordination normal.  Skin: Skin is warm and dry. No rash noted. He is not diaphoretic.  Psychiatric: He has a normal mood and affect. His behavior is normal. Judgment and thought content normal.  Vitals reviewed.         Assessment & Plan:  Controlled type 2 diabetes mellitus. Hemoglobin A1c has increased from 7% to 7.8%. Was to continue with metformin and doesn't want to add more medication.  Patient wants to continue to work on diet exercise and weight loss efforts.  Low HDL cholesterol-level is 38. Remainder of lipid panel is normal and he is not a lipid-lowering medication. He takes krill oil and does not want to be on statin medication  Situational stress with wife who has metastatic breast cancer.  Blood pressure is stable.  Obesity-BMI is 32.36. Needs to weigh closer to 200 pounds.  Plan: Continue to work on diet exercise and weight loss efforts. Follow-up in 6 months. Flu vaccine given. Pneumococcal 23 vaccine given. Continue same medications. Refuses statin medication.

## 2016-12-13 ENCOUNTER — Other Ambulatory Visit: Payer: Self-pay | Admitting: Internal Medicine

## 2017-04-11 ENCOUNTER — Other Ambulatory Visit: Payer: Self-pay

## 2017-04-11 DIAGNOSIS — R030 Elevated blood-pressure reading, without diagnosis of hypertension: Secondary | ICD-10-CM

## 2017-04-11 DIAGNOSIS — E119 Type 2 diabetes mellitus without complications: Secondary | ICD-10-CM

## 2017-04-11 DIAGNOSIS — E781 Pure hyperglyceridemia: Secondary | ICD-10-CM

## 2017-05-02 ENCOUNTER — Other Ambulatory Visit: Payer: Medicare Other | Admitting: Internal Medicine

## 2017-05-02 DIAGNOSIS — E119 Type 2 diabetes mellitus without complications: Secondary | ICD-10-CM

## 2017-05-02 DIAGNOSIS — R030 Elevated blood-pressure reading, without diagnosis of hypertension: Secondary | ICD-10-CM

## 2017-05-02 DIAGNOSIS — E781 Pure hyperglyceridemia: Secondary | ICD-10-CM

## 2017-05-03 LAB — LIPID PANEL
CHOL/HDL RATIO: 4.6 (calc) (ref <5.0)
Cholesterol: 160 mg/dL (ref <200)
HDL: 35 mg/dL — AB (ref >40)
LDL CHOLESTEROL (CALC): 99 mg/dL
NON-HDL CHOLESTEROL (CALC): 125 mg/dL (ref <130)
TRIGLYCERIDES: 157 mg/dL — AB (ref <150)

## 2017-05-03 LAB — HEPATIC FUNCTION PANEL
AG Ratio: 1.7 (calc) (ref 1.0–2.5)
ALKALINE PHOSPHATASE (APISO): 56 U/L (ref 40–115)
ALT: 20 U/L (ref 9–46)
AST: 24 U/L (ref 10–35)
Albumin: 4.3 g/dL (ref 3.6–5.1)
BILIRUBIN DIRECT: 0.1 mg/dL (ref 0.0–0.2)
GLOBULIN: 2.6 g/dL (ref 1.9–3.7)
Indirect Bilirubin: 0.7 mg/dL (calc) (ref 0.2–1.2)
Total Bilirubin: 0.8 mg/dL (ref 0.2–1.2)
Total Protein: 6.9 g/dL (ref 6.1–8.1)

## 2017-05-03 LAB — HEMOGLOBIN A1C
Hgb A1c MFr Bld: 7.9 % of total Hgb — ABNORMAL HIGH (ref <5.7)
Mean Plasma Glucose: 180 (calc)
eAG (mmol/L): 10 (calc)

## 2017-05-05 ENCOUNTER — Ambulatory Visit (INDEPENDENT_AMBULATORY_CARE_PROVIDER_SITE_OTHER): Payer: Medicare Other | Admitting: Internal Medicine

## 2017-05-05 ENCOUNTER — Encounter: Payer: Self-pay | Admitting: Internal Medicine

## 2017-05-05 VITALS — BP 140/90 | HR 74 | Ht 70.5 in | Wt 224.0 lb

## 2017-05-05 DIAGNOSIS — E786 Lipoprotein deficiency: Secondary | ICD-10-CM

## 2017-05-05 DIAGNOSIS — E119 Type 2 diabetes mellitus without complications: Secondary | ICD-10-CM | POA: Diagnosis not present

## 2017-05-05 DIAGNOSIS — R030 Elevated blood-pressure reading, without diagnosis of hypertension: Secondary | ICD-10-CM

## 2017-05-05 NOTE — Progress Notes (Signed)
   Subjective:    Patient ID: CATRELL MORRONE, male    DOB: [birth year], 67 y.o.   MRN: [id number]  HPI 67 year old male in today for 84-month recheck.  He feels well.  History of borderline hypertension, diabetes mellitus BMI currently 31.69.  Needs to lose about 25 pounds.  Has hyperlipidemia.  Has not wanted to be on statin medication.  History of impaired glucose tolerance treated with metformin 500 mg twice daily and he also takes Krill oil.  His hemoglobin A1c is 7.9% in 6 months ago was 7.8%.  Needs to get more exercise.  Liver panel is normal.  Lipid panel off statin medication is normal with the exception of triglycerides slightly elevated at 157 normal being up to 150.  Review of Systems     Objective:   Physical Exam  Skin warm and dry.  Nodes none.  Neck is supple.  No thyromegaly or carotid bruits.  Chest clear.  Cardiac exam regular rate and rhythm.  Extremities without edema.      Assessment & Plan:  Non-insulin-dependent diabetes mellitus- he is on metformin only and does not want to be on another medication  Mild hypertriglyceridemia-continue diet and exercise efforts  BMI 31.69  Borderline hypertension-does not want to be on antihypertensive medication  Plan: Encourage diet exercise and weight loss and see if these issues improved.  Return in 6 months for physical exam.

## 2017-05-18 NOTE — Patient Instructions (Signed)
Continue diet exercise and weight loss efforts.  Continue metformin.  Return in 6 months.

## 2017-06-23 ENCOUNTER — Other Ambulatory Visit: Payer: Self-pay

## 2017-06-23 MED ORDER — SILDENAFIL CITRATE 100 MG PO TABS: ORAL_TABLET | ORAL | 99 refills | Status: DC

## 2017-08-08 ENCOUNTER — Encounter: Payer: Self-pay | Admitting: Internal Medicine

## 2017-08-08 ENCOUNTER — Ambulatory Visit (INDEPENDENT_AMBULATORY_CARE_PROVIDER_SITE_OTHER): Payer: Medicare Other | Admitting: Internal Medicine

## 2017-08-08 VITALS — BP 160/78 | HR 80 | Temp 98.3°F | Ht 70.5 in | Wt 223.0 lb

## 2017-08-08 DIAGNOSIS — L237 Allergic contact dermatitis due to plants, except food: Secondary | ICD-10-CM

## 2017-08-08 MED ORDER — PREDNISONE 10 MG PO TABS: 10.0000 mg | ORAL_TABLET | Freq: Every day | ORAL | 0 refills | Status: DC

## 2017-08-20 NOTE — Progress Notes (Signed)
   Subjective:    Patient ID: Steven Wheeler, male    DOB: Apr 04, 1950, 67 y.o.   MRN: [id number]  HPI 67 year old Male seen today with rash that is consistent with contact dermatitis.  He has been doing yard work and says he believes he was exposed to poison ivy.  Has been very itchy and not responding to over-the-counter topical medication    Review of Systems see above     Objective:   Physical Exam  Macular papular erythematous rash consistent with contact dermatitis      Assessment & Plan:  Contact dermatitis  Plan: Take prednisone and tapering course as directed starting with 60 mg and decreasing to 0 mg over 12 days.

## 2017-09-27 ENCOUNTER — Other Ambulatory Visit: Payer: Self-pay | Admitting: Internal Medicine

## 2017-10-26 ENCOUNTER — Other Ambulatory Visit: Payer: Self-pay | Admitting: Internal Medicine

## 2017-10-26 DIAGNOSIS — Z0000 Encounter for general adult medical examination without abnormal findings: Secondary | ICD-10-CM

## 2017-10-26 DIAGNOSIS — Z125 Encounter for screening for malignant neoplasm of prostate: Secondary | ICD-10-CM

## 2017-10-26 DIAGNOSIS — E781 Pure hyperglyceridemia: Secondary | ICD-10-CM

## 2017-10-26 DIAGNOSIS — E118 Type 2 diabetes mellitus with unspecified complications: Secondary | ICD-10-CM

## 2017-10-31 ENCOUNTER — Other Ambulatory Visit: Payer: Medicare Other | Admitting: Internal Medicine

## 2017-10-31 DIAGNOSIS — Z0000 Encounter for general adult medical examination without abnormal findings: Secondary | ICD-10-CM

## 2017-10-31 DIAGNOSIS — E781 Pure hyperglyceridemia: Secondary | ICD-10-CM | POA: Diagnosis not present

## 2017-10-31 DIAGNOSIS — E118 Type 2 diabetes mellitus with unspecified complications: Secondary | ICD-10-CM | POA: Diagnosis not present

## 2017-10-31 DIAGNOSIS — Z125 Encounter for screening for malignant neoplasm of prostate: Secondary | ICD-10-CM | POA: Diagnosis not present

## 2017-11-01 LAB — COMPLETE METABOLIC PANEL WITH GFR
AG Ratio: 1.9 (calc) (ref 1.0–2.5)
ALT: 17 U/L (ref 9–46)
AST: 26 U/L (ref 10–35)
Albumin: 4.3 g/dL (ref 3.6–5.1)
Alkaline phosphatase (APISO): 57 U/L (ref 40–115)
BILIRUBIN TOTAL: 0.6 mg/dL (ref 0.2–1.2)
BUN: 15 mg/dL (ref 7–25)
CHLORIDE: 104 mmol/L (ref 98–110)
CO2: 26 mmol/L (ref 20–32)
Calcium: 9.6 mg/dL (ref 8.6–10.3)
Creat: 1.09 mg/dL (ref 0.70–1.25)
GFR, Est African American: 80 mL/min/{1.73_m2} (ref >=60)
GFR, Est Non African American: 69 mL/min/{1.73_m2} (ref >=60)
Globulin: 2.3 g/dL (calc) (ref 1.9–3.7)
Glucose, Bld: 195 mg/dL — ABNORMAL HIGH (ref 65–99)
Potassium: 4.6 mmol/L (ref 3.5–5.3)
Sodium: 140 mmol/L (ref 135–146)
TOTAL PROTEIN: 6.6 g/dL (ref 6.1–8.1)

## 2017-11-01 LAB — CBC WITH DIFFERENTIAL/PLATELET
Basophils Absolute: 83 cells/uL (ref 0–200)
Basophils Relative: 1.1 %
EOS PCT: 6 %
Eosinophils Absolute: 450 cells/uL (ref 15–500)
HEMATOCRIT: 43.7 % (ref 38.5–50.0)
HEMOGLOBIN: 14.6 g/dL (ref 13.2–17.1)
LYMPHS ABS: 2145 {cells}/uL (ref 850–3900)
MCH: 29 pg (ref 27.0–33.0)
MCHC: 33.4 g/dL (ref 32.0–36.0)
MCV: 86.7 fL (ref 80.0–100.0)
MONOS PCT: 8.4 %
MPV: 10.1 fL (ref 7.5–12.5)
Neutro Abs: 4193 cells/uL (ref 1500–7800)
Neutrophils Relative %: 55.9 %
Platelets: 250 10*3/uL (ref 140–400)
RBC: 5.04 10*6/uL (ref 4.20–5.80)
RDW: 11.7 % (ref 11.0–15.0)
Total Lymphocyte: 28.6 %
WBC mixed population: 630 cells/uL (ref 200–950)
WBC: 7.5 10*3/uL (ref 3.8–10.8)

## 2017-11-01 LAB — HEMOGLOBIN A1C
EAG (MMOL/L): 11.2 (calc)
Hgb A1c MFr Bld: 8.7 % of total Hgb — ABNORMAL HIGH (ref <5.7)
Mean Plasma Glucose: 203 (calc)

## 2017-11-01 LAB — LIPID PANEL
CHOL/HDL RATIO: 4.3 (calc) (ref <5.0)
Cholesterol: 160 mg/dL (ref <200)
HDL: 37 mg/dL — ABNORMAL LOW (ref >40)
LDL Cholesterol (Calc): 99 mg/dL (calc)
NON-HDL CHOLESTEROL (CALC): 123 mg/dL (ref <130)
TRIGLYCERIDES: 138 mg/dL (ref <150)

## 2017-11-01 LAB — PSA: PSA: 0.5 ng/mL (ref <=4.0)

## 2017-11-01 LAB — MICROALBUMIN / CREATININE URINE RATIO
Creatinine, Urine: 292 mg/dL (ref 20–320)
MICROALB/CREAT RATIO: 4 ug/mg{creat} (ref <30)
Microalb, Ur: 1.2 mg/dL

## 2017-11-03 ENCOUNTER — Encounter: Payer: Self-pay | Admitting: Internal Medicine

## 2017-11-03 ENCOUNTER — Ambulatory Visit (INDEPENDENT_AMBULATORY_CARE_PROVIDER_SITE_OTHER): Payer: Medicare Other | Admitting: Internal Medicine

## 2017-11-03 VITALS — BP 120/80 | HR 70 | Ht 70.5 in | Wt 219.0 lb

## 2017-11-03 DIAGNOSIS — E119 Type 2 diabetes mellitus without complications: Secondary | ICD-10-CM

## 2017-11-03 DIAGNOSIS — F439 Reaction to severe stress, unspecified: Secondary | ICD-10-CM

## 2017-11-03 DIAGNOSIS — Z0000 Encounter for general adult medical examination without abnormal findings: Secondary | ICD-10-CM

## 2017-11-03 DIAGNOSIS — E786 Lipoprotein deficiency: Secondary | ICD-10-CM | POA: Diagnosis not present

## 2017-11-03 DIAGNOSIS — Z23 Encounter for immunization: Secondary | ICD-10-CM | POA: Diagnosis not present

## 2017-11-03 LAB — POCT URINALYSIS DIPSTICK
APPEARANCE: NORMAL
BILIRUBIN UA: NEGATIVE
Blood, UA: NEGATIVE
GLUCOSE UA: NEGATIVE
KETONES UA: NEGATIVE
Leukocytes, UA: NEGATIVE
Nitrite, UA: NEGATIVE
ODOR: NORMAL
Protein, UA: NEGATIVE
Spec Grav, UA: 1.015 (ref 1.010–1.025)
UROBILINOGEN UA: 0.2 U/dL
pH, UA: 6.5 (ref 5.0–8.0)

## 2017-11-03 NOTE — Progress Notes (Signed)
Subjective:    Patient ID: Steven Wheeler, male    DOB: 07/20/1950, 67 y.o.   MRN: [id number]  HPI 67 year old Male for Medicare wellness, health maintenance exam and evaluation of medical issues.  History of controlled type 2 diabetes mellitus without complication treated with metformin twice daily.  Hgb AIC elevated. Lipids are normal.  Wife has had a setback in her metastatic breast cancer treatment.  She is on a new medication and has not been feeling well.  He has diabetic eye exam each Fall.  Past medical history: History of hepatitis A around 1982.  History of fractured left arm at age 68.  Wisdom teeth extracted x4 at approximately age 50  He had a virtual colonoscopy in May 2009.  Needs to have repeat study.  Would recommend regular colonoscopy.  History of erectile dysfunction treated with Viagra.  Social history: Married.  Wife is a retired Pharmacist, hospital.  2 adult daughters.  He is a retired Water engineer.  Non-smoker.  Social alcohol consumption.  Family history: Father died of an MI.  Mother recently passed away of complications of septicemia in her late 61s with history of dementia, congestive heart failure and vision loss in eye.  One brother in good health.  3 sisters.  One sister died of cancer.  2 sisters living 1 of whom has hypertension.      Review of Systems  Constitutional: Negative.   All other systems reviewed and are negative.      Objective:   Physical Exam  Constitutional: He is oriented to person, place, and time. He appears well-developed and well-nourished. No distress.  HENT:  Head: Atraumatic.  Right Ear: External ear normal.  Left Ear: External ear normal.  Mouth/Throat: Oropharynx is clear and moist. No oropharyngeal exudate.  Eyes: Pupils are equal, round, and reactive to light. EOM are normal. Right eye exhibits no discharge. Left eye exhibits no discharge. No scleral icterus.  Neck: Neck supple. No JVD present. No thyromegaly  present.  Cardiovascular: Normal rate, regular rhythm, normal heart sounds and intact distal pulses.  No murmur heard. Pulmonary/Chest: Effort normal. No respiratory distress. He has no wheezes. He has no rales.  Abdominal: Soft. Bowel sounds are normal. He exhibits no distension and no mass. There is no tenderness. There is no guarding.  Genitourinary: Prostate normal.  Musculoskeletal: He exhibits no edema.  Lymphadenopathy:    He has no cervical adenopathy.  Neurological: He is alert and oriented to person, place, and time. He displays normal reflexes. No cranial nerve deficit or sensory deficit. He exhibits normal muscle tone. Coordination normal.  Skin: Skin is warm and dry. No rash noted. He is not diaphoretic. No erythema.  Psychiatric: He has a normal mood and affect. His behavior is normal. Judgment and thought content normal.  Vitals reviewed.         Assessment & Plan:  Controlled type 2 diabetes mellitus.  Fasting serum glucose 195.  Hemoglobin A1c has increased from 7.9% 8.7%.  Situational stress with wife who metastatic breast cancer  Has not wanted to be on statin medication.  Lipids have improved from 6 months ago.  Triglycerides were 157 and are now 138.  Total cholesterol is 160.  He has a low HDL cholesterol at 37.  LDL cholesterol is 99.  Low HDL cholesterol  Plan: Patient agrees to see a dietitian.  He will watch his diet.  Return in 8 weeks for office visit and hemoglobin A1c.  Flu vaccine  given.  Subjective:   Patient presents for Medicare Annual/Subsequent preventive examination.  Review Past Medical/Family/Social: See above   Risk Factors  Current exercise habits: Exercises regularly Dietary issues discussed: Low-fat low carbohydrate  Cardiac risk factors: Diabetes mellitus and hypertriglyceridemia.  Family history  Depression Screen  (Note: if answer to either of the following is "Yes", a more complete depression screening is indicated)   Over  the past two weeks, have you felt down, depressed or hopeless? No  Over the past two weeks, have you felt little interest or pleasure in doing things? No Have you lost interest or pleasure in daily life? No Do you often feel hopeless? No Do you cry easily over simple problems? No   Activities of Daily Living  In your present state of health, do you have any difficulty performing the following activities?:   Driving? No  Managing money? No  Feeding yourself? No  Getting from bed to chair? No  Climbing a flight of stairs? No  Preparing food and eating?: No  Bathing or showering? No  Getting dressed: No  Getting to the toilet? No  Using the toilet:No  Moving around from place to place: No  In the past year have you fallen or had a near fall?:No  Are you sexually active? yes Do you have more than one partner? No   Hearing Difficulties: No  Do you often ask people to speak up or repeat themselves? No  Do you experience ringing or noises in your ears? No  Do you have difficulty understanding soft or whispered voices? No  Do you feel that you have a problem with memory? No Do you often misplace items? No    Home Safety:  Do you have a smoke alarm at your residence? Yes Do you have grab bars in the bathroom?  No Do you have throw rugs in your house?  Yes   Cognitive Testing  Alert? Yes Normal Appearance?Yes  Oriented to person? Yes Place? Yes  Time? Yes  Recall of three objects? Yes  Can perform simple calculations? Yes  Displays appropriate judgment?Yes  Can read the correct time from a watch face?Yes   List the Names of Other Physician/Practitioners you currently use:  See referral list for the physicians patient is currently seeing.     Review of Systems: See above   Objective:     General appearance: Appears younger than stated age Head: Normocephalic, without obvious abnormality, atraumatic  Eyes: conj clear, EOMi PEERLA  Ears: normal TM's and external ear  canals both ears  Nose: Nares normal. Septum midline. Mucosa normal. No drainage or sinus tenderness.  Throat: lips, mucosa, and tongue normal; teeth and gums normal  Neck: no adenopathy, no carotid bruit, no JVD, supple, symmetrical, trachea midline and thyroid not enlarged, symmetric, no tenderness/mass/nodules  No CVA tenderness.  Lungs: clear to auscultation bilaterally  Breasts: normal appearance, no masses or tenderness Heart: regular rate and rhythm, S1, S2 normal, no murmur, click, rub or gallop  Abdomen: soft, non-tender; bowel sounds normal; no masses, no organomegaly  Musculoskeletal: ROM normal in all joints, no crepitus, no deformity, Normal muscle strengthen. Back  is symmetric, no curvature. Skin: Skin color, texture, turgor normal. No rashes or lesions  Lymph nodes: Cervical, supraclavicular, and axillary nodes normal.  Neurologic: CN 2 -12 Normal, Normal symmetric reflexes. Normal coordination and gait  Psych: Alert & Oriented x 3, Mood appear stable.    Assessment:    Annual wellness medicare exam  Plan:    During the course of the visit the patient was educated and counseled about appropriate screening and preventive services including:  Annual flu vaccine      Patient Instructions (the written plan) was given to the patient.  Medicare Attestation  I have personally reviewed:  The patient's medical and social history  Their use of alcohol, tobacco or illicit drugs  Their current medications and supplements  The patient's functional ability including ADLs,fall risks, home safety risks, cognitive, and hearing and visual impairment  Diet and physical activities  Evidence for depression or mood disorders  The patient's weight, height, BMI, and visual acuity have been recorded in the chart. I have made referrals, counseling, and provided education to the patient based on review of the above and I have provided the patient with a written personalized care plan for  preventive services.

## 2017-11-03 NOTE — Patient Instructions (Addendum)
Watch diet and RTC in 8 weeks for OV and Hgb AIC. See dieitican.

## 2017-11-15 ENCOUNTER — Encounter: Payer: Medicare Other | Attending: Internal Medicine | Admitting: *Deleted

## 2017-11-15 DIAGNOSIS — E8881 Metabolic syndrome: Secondary | ICD-10-CM | POA: Diagnosis not present

## 2017-11-15 DIAGNOSIS — Z713 Dietary counseling and surveillance: Secondary | ICD-10-CM | POA: Diagnosis not present

## 2017-11-15 DIAGNOSIS — E669 Obesity, unspecified: Secondary | ICD-10-CM | POA: Insufficient documentation

## 2017-11-15 DIAGNOSIS — E119 Type 2 diabetes mellitus without complications: Secondary | ICD-10-CM | POA: Diagnosis not present

## 2017-11-15 DIAGNOSIS — E118 Type 2 diabetes mellitus with unspecified complications: Secondary | ICD-10-CM

## 2017-11-15 DIAGNOSIS — E781 Pure hyperglyceridemia: Secondary | ICD-10-CM | POA: Diagnosis not present

## 2017-11-17 NOTE — Patient Instructions (Signed)
Plan:  Aim for 3 Carb Choices per meal (45 grams) +/- 1 either way  Aim for 0-2 Carbs per snack if hungry  Include protein in moderation with your meals and snacks Consider alternate beverages that do not have sugar in them Consider reading food labels for Total Carbohydrate of foods Continue with your activity level by walking for 60 minutes daily as tolerated Consider taking medication as directed by MD

## 2017-11-17 NOTE — Progress Notes (Signed)
Diabetes Self-Management Education  Visit Type:  Follow-up  Appt. Start Time: 1545 Appt. End Time: 2297  11/17/2017  [name], identified by name and date of birth, is a 67 y.o. male with a diagnosis of Diabetes:  Patient is here with his wife who participated in the visit. Patient states his mother passed away last week after a long decline in health with Alzheimer's. His wife has cancer and they have been dealing with that for several years as well. So his stress level has been and will be quite high for the foreseable future. Diet history obtained, he does drink half and half tea in restaurants but other beverages have no sugar. He also walks daily for an hour every morning. He does not check his BG at this time. ASSESSMENT  There were no vitals taken for this visit. There is no height or weight on file to calculate BMI.   Diabetes Self-Management Education - 11/15/17 1559      Health Coping   How would you rate your overall health?  Excellent      Psychosocial Assessment   Patient Belief/Attitude about Diabetes  Motivated to manage diabetes    Self-care barriers  None    Self-management support  Family    Patient Concerns  Nutrition/Meal planning    Special Needs  None    Preferred Learning Style  No preference indicated    Learning Readiness  Change in progress      Complications   Last HgB A1C per patient/outside source  --   7.1   How often do you check your blood sugar?  0 times/day (not testing)    Number of hypoglycemic episodes per month  --   0   Have you had a dilated eye exam in the past 12 months?  Yes    Have you had a dental exam in the past 12 months?  Yes    Are you checking your feet?  No      Dietary Intake   Breakfast  1/2 bagel with PNB OR oatmeal occasionally with honey or banana OR Cheerios with milk occasionally with banana    Snack (morning)  no    Lunch  eat out most days: 2 veg and a meat, occasionally with 1/2 slice bread    Snack  (afternoon)  no    Dinner  light meal - potato soup with chicken OR casserole OR cereal    Snack (evening)  not usually due to late dinner    Beverage(s)  coffee with creamer, 1/2 and 1/2 tea, mineral water,       Exercise   Exercise Type  Light (walking / raking leaves)    How many days per week to you exercise?  --   7   How many minutes per day do you exercise?  --   60 minutes     Patient Education   Previous Diabetes Education  No    Disease state   Factors that contribute to the development of diabetes    Nutrition management   Role of diet in the treatment of diabetes and the relationship between the three main macronutrients and blood glucose level;Food label reading, portion sizes and measuring food.;Carbohydrate counting;Reviewed blood glucose goals for pre and post meals and how to evaluate the patients' food intake on their blood glucose level.    Physical activity and exercise   Role of exercise on diabetes management, blood pressure control and cardiac health.  Medications  Reviewed patients medication for diabetes, action, purpose, timing of dose and side effects.    Monitoring  Purpose and frequency of SMBG.;Identified appropriate SMBG and/or A1C goals.    Psychosocial adjustment  Role of stress on diabetes      Individualized Goals (developed by patient)   Nutrition  Follow meal plan discussed    Physical Activity  60 minutes per day    Medications  take my medication as prescribed    Monitoring   Not Applicable      Patient Self-Evaluation of Goals - Patient rates self as meeting previously set goals (% of time)   Nutrition  >75%    Physical Activity  >75%    Medications  >75%    Monitoring  Not Applicable    Problem Solving  >75%    Reducing Risk  >75%    Health Coping  >75%      Outcomes   Program Status  Completed      Subsequent Visit   Since your last visit have you continued or begun to take your medications as prescribed?  Yes    Since your last  visit, are you checking your blood glucose at least once a day?  No       Learning Objective:  Patient will have a greater understanding of diabetes self-management. Patient education plan is to attend individual and/or group sessions per assessed needs and concerns.  Plan:   Patient Instructions  Plan:  Aim for 3 Carb Choices per meal (45 grams) +/- 1 either way  Aim for 0-2 Carbs per snack if hungry  Include protein in moderation with your meals and snacks Consider alternate beverages that do not have sugar in them Consider reading food labels for Total Carbohydrate of foods Continue with your activity level by walking for 60 minutes daily as tolerated Consider taking medication as directed by MD  Expected Outcomes:  Demonstrated interest in learning. Expect positive outcomes  Education material provided: Food label handouts, A1C conversion sheet, Meal plan card and Carbohydrate counting sheet  If problems or questions, patient to contact team via:  Phone  Future DSME appointment: - PRN

## 2017-12-26 ENCOUNTER — Other Ambulatory Visit: Payer: Medicare Other | Admitting: Internal Medicine

## 2017-12-29 ENCOUNTER — Ambulatory Visit: Payer: Medicare Other | Admitting: Internal Medicine

## 2018-01-02 ENCOUNTER — Other Ambulatory Visit: Payer: Medicare Other | Admitting: Internal Medicine

## 2018-01-06 ENCOUNTER — Ambulatory Visit: Payer: Medicare Other | Admitting: Internal Medicine

## 2018-02-03 ENCOUNTER — Telehealth: Payer: Self-pay

## 2018-02-03 DIAGNOSIS — E119 Type 2 diabetes mellitus without complications: Secondary | ICD-10-CM

## 2018-02-03 NOTE — Telephone Encounter (Signed)
Future orders placed for labs

## 2018-02-06 ENCOUNTER — Other Ambulatory Visit: Payer: Self-pay | Admitting: Internal Medicine

## 2018-02-06 ENCOUNTER — Other Ambulatory Visit: Payer: Medicare Other | Admitting: Internal Medicine

## 2018-02-06 DIAGNOSIS — E119 Type 2 diabetes mellitus without complications: Secondary | ICD-10-CM

## 2018-02-10 ENCOUNTER — Encounter: Payer: Self-pay | Admitting: Internal Medicine

## 2018-02-10 ENCOUNTER — Ambulatory Visit (INDEPENDENT_AMBULATORY_CARE_PROVIDER_SITE_OTHER): Payer: Medicare Other | Admitting: Internal Medicine

## 2018-02-10 VITALS — BP 130/70 | HR 78 | Ht 70.5 in | Wt 216.0 lb

## 2018-02-10 DIAGNOSIS — E119 Type 2 diabetes mellitus without complications: Secondary | ICD-10-CM

## 2018-02-10 DIAGNOSIS — F411 Generalized anxiety disorder: Secondary | ICD-10-CM | POA: Diagnosis not present

## 2018-02-10 DIAGNOSIS — F439 Reaction to severe stress, unspecified: Secondary | ICD-10-CM | POA: Diagnosis not present

## 2018-02-10 LAB — HEMOGLOBIN A1C
EAG (MMOL/L): 9.3 (calc)
Hgb A1c MFr Bld: 7.5 % of total Hgb — ABNORMAL HIGH (ref <5.7)
Mean Plasma Glucose: 169 (calc)

## 2018-02-10 MED ORDER — ALPRAZOLAM 1 MG PO TABS: ORAL_TABLET | ORAL | 0 refills | Status: DC

## 2018-02-10 NOTE — Progress Notes (Signed)
   Subjective:    Patient ID: Steven Wheeler, male    DOB: 1950-10-28, 67 y.o.   MRN: [id number]  HPI 67 year old Male for 6 month follow up. Hgb AIC was 8.7% in Sept and now it is 7.5 %.  Patient has been under considerable stress recently.  His wife was diagnosed with metastatic breast carcinoma to the liver.  He went to [facility] for a few days to see Christmas lights.  They enjoyed their trip.  However they are quite worried about her condition.  Dr. Jana Hakim is making some recommendations.  I have given him a prescription for Xanax.  Sometimes he does not sleep well.  Spent 15 minutes speaking with him about diabetic control, situational stress, anxiety and insomnia.  He is dealing with situation incredibly well.  He went to dietitian.  He has been eating better.  He was started on metformin 500 mg twice daily in August.  We are not going to change his medication and he will continue to monitor his diet since he has had an improvement.       Review of Systems see above    Objective:   Physical Exam Neck supple.  Chest clear.  Cardiac exam regular rate and rhythm normal S1 and S2.  Extremities without edema.  Vital signs reviewed.       Assessment & Plan:  Controlled type 2 diabetes mellitus now on metformin twice daily.  Continue to monitor.  Situational stress  Anxiety and insomnia.  Xanax prescribed.  Plan: Follow-up Spring 2020.  Physical exam is due September 2019.

## 2018-02-18 NOTE — Patient Instructions (Signed)
Take Xanax as needed for anxiety and insomnia.  Continue Metformin.  Follow-up Spring 2020.  Physical exam due September 2020

## 2018-03-26 ENCOUNTER — Other Ambulatory Visit: Payer: Self-pay | Admitting: Internal Medicine

## 2018-11-06 ENCOUNTER — Other Ambulatory Visit: Payer: Self-pay

## 2018-11-06 ENCOUNTER — Other Ambulatory Visit (INDEPENDENT_AMBULATORY_CARE_PROVIDER_SITE_OTHER): Payer: Medicare Other | Admitting: Internal Medicine

## 2018-11-06 DIAGNOSIS — E119 Type 2 diabetes mellitus without complications: Secondary | ICD-10-CM

## 2018-11-06 DIAGNOSIS — E786 Lipoprotein deficiency: Secondary | ICD-10-CM

## 2018-11-06 DIAGNOSIS — Z125 Encounter for screening for malignant neoplasm of prostate: Secondary | ICD-10-CM

## 2018-11-06 DIAGNOSIS — F411 Generalized anxiety disorder: Secondary | ICD-10-CM

## 2018-11-06 DIAGNOSIS — Z0000 Encounter for general adult medical examination without abnormal findings: Secondary | ICD-10-CM

## 2018-11-06 DIAGNOSIS — F439 Reaction to severe stress, unspecified: Secondary | ICD-10-CM | POA: Diagnosis not present

## 2018-11-07 LAB — CBC WITH DIFFERENTIAL/PLATELET
Absolute Monocytes: 632 {cells}/uL (ref 200–950)
Basophils Absolute: 61 {cells}/uL (ref 0–200)
Basophils Relative: 0.9 %
Eosinophils Absolute: 163 {cells}/uL (ref 15–500)
Eosinophils Relative: 2.4 %
HCT: 45.7 % (ref 38.5–50.0)
Hemoglobin: 15.1 g/dL (ref 13.2–17.1)
Lymphs Abs: 2067 {cells}/uL (ref 850–3900)
MCH: 28.8 pg (ref 27.0–33.0)
MCHC: 33 g/dL (ref 32.0–36.0)
MCV: 87.2 fL (ref 80.0–100.0)
MPV: 10.3 fL (ref 7.5–12.5)
Monocytes Relative: 9.3 %
Neutro Abs: 3876 {cells}/uL (ref 1500–7800)
Neutrophils Relative %: 57 %
Platelets: 248 Thousand/uL (ref 140–400)
RBC: 5.24 Million/uL (ref 4.20–5.80)
RDW: 11.9 % (ref 11.0–15.0)
Total Lymphocyte: 30.4 %
WBC: 6.8 Thousand/uL (ref 3.8–10.8)

## 2018-11-07 LAB — LIPID PANEL
Cholesterol: 147 mg/dL (ref <200)
HDL: 37 mg/dL — ABNORMAL LOW (ref >=40)
LDL Cholesterol (Calc): 83 mg/dL
Non-HDL Cholesterol (Calc): 110 mg/dL (ref <130)
Total CHOL/HDL Ratio: 4 (calc) (ref <5.0)
Triglycerides: 168 mg/dL — ABNORMAL HIGH (ref <150)

## 2018-11-07 LAB — COMPLETE METABOLIC PANEL WITHOUT GFR
AG Ratio: 1.9 (calc) (ref 1.0–2.5)
ALT: 16 U/L (ref 9–46)
AST: 22 U/L (ref 10–35)
Albumin: 4.5 g/dL (ref 3.6–5.1)
Alkaline phosphatase (APISO): 61 U/L (ref 35–144)
BUN: 17 mg/dL (ref 7–25)
CO2: 26 mmol/L (ref 20–32)
Calcium: 9.8 mg/dL (ref 8.6–10.3)
Chloride: 104 mmol/L (ref 98–110)
Creat: 0.93 mg/dL (ref 0.70–1.25)
GFR, Est African American: 97 mL/min/1.73m2 (ref >=60)
GFR, Est Non African American: 83 mL/min/1.73m2 (ref >=60)
Globulin: 2.4 g/dL (ref 1.9–3.7)
Glucose, Bld: 173 mg/dL — ABNORMAL HIGH (ref 65–99)
Potassium: 5 mmol/L (ref 3.5–5.3)
Sodium: 138 mmol/L (ref 135–146)
Total Bilirubin: 0.6 mg/dL (ref 0.2–1.2)
Total Protein: 6.9 g/dL (ref 6.1–8.1)

## 2018-11-07 LAB — MICROALBUMIN / CREATININE URINE RATIO
Creatinine, Urine: 97 mg/dL (ref 20–320)
Microalb Creat Ratio: 3 mcg/mg creat (ref <30)
Microalb, Ur: 0.3 mg/dL

## 2018-11-07 LAB — HEMOGLOBIN A1C
Hgb A1c MFr Bld: 7.7 %{Hb} — ABNORMAL HIGH (ref <5.7)
Mean Plasma Glucose: 174 (calc)
eAG (mmol/L): 9.7 (calc)

## 2018-11-07 LAB — PSA: PSA: 0.4 ng/mL (ref <=4.0)

## 2018-11-09 ENCOUNTER — Ambulatory Visit (INDEPENDENT_AMBULATORY_CARE_PROVIDER_SITE_OTHER): Payer: Medicare Other | Admitting: Internal Medicine

## 2018-11-09 ENCOUNTER — Encounter: Payer: Self-pay | Admitting: Internal Medicine

## 2018-11-09 ENCOUNTER — Other Ambulatory Visit: Payer: Self-pay

## 2018-11-09 VITALS — BP 130/70 | HR 61 | Temp 98.3°F | Ht 70.5 in | Wt 213.0 lb

## 2018-11-09 DIAGNOSIS — Z0000 Encounter for general adult medical examination without abnormal findings: Secondary | ICD-10-CM

## 2018-11-09 DIAGNOSIS — E781 Pure hyperglyceridemia: Secondary | ICD-10-CM

## 2018-11-09 DIAGNOSIS — Z23 Encounter for immunization: Secondary | ICD-10-CM

## 2018-11-09 DIAGNOSIS — E786 Lipoprotein deficiency: Secondary | ICD-10-CM

## 2018-11-09 DIAGNOSIS — E1169 Type 2 diabetes mellitus with other specified complication: Secondary | ICD-10-CM

## 2018-11-09 DIAGNOSIS — F411 Generalized anxiety disorder: Secondary | ICD-10-CM | POA: Diagnosis not present

## 2018-11-09 DIAGNOSIS — F439 Reaction to severe stress, unspecified: Secondary | ICD-10-CM

## 2018-11-09 LAB — POCT URINALYSIS DIPSTICK
Appearance: NEGATIVE
Bilirubin, UA: NEGATIVE
Blood, UA: NEGATIVE
Glucose, UA: NEGATIVE
Ketones, UA: NEGATIVE
Leukocytes, UA: NEGATIVE
Nitrite, UA: NEGATIVE
Odor: NEGATIVE
Protein, UA: NEGATIVE
Spec Grav, UA: 1.015 (ref 1.010–1.025)
Urobilinogen, UA: 0.2 E.U./dL
pH, UA: 6.5 (ref 5.0–8.0)

## 2018-11-09 NOTE — Progress Notes (Signed)
Subjective:    Patient ID: Steven Wheeler, male    DOB: Sep 08, 1950, 68 y.o.   MRN: [id number]  HPI 68 year old Male for Medicare wellness, health maintenance exam and evaluation of medical issues.  He has a history o type 2 diabetes mellitus, hypertriglyceridemia, borderline hypertension, metabolic syndrome.  His wife is critically ill with breast cancer.  He is spending a great deal of time with her.  She requires a lot of attention, help with ambulation especially to the bathroom, meal preparation, medication administration.  She has been treated by Dr. Jana Hakim.  Currently chemotherapy has been held.  They are hoping to get the beach for a few days.  His hemoglobin A1c has increased from 7.5% in December to 7.7%.  Last year it was 8.7%.  Triglycerides are elevated at 168 and in September last year were 138.  Fasting glucose is 173.  Liver functions are normal.  CBC is normal.  PSA is normal.  Currently on metformin 500 mg twice daily.  Needs to be on more aggressive treatment for diabetes but he does not want to do that.  He has Xanax to take for anxiety.  He does not want to be on statin medication.  Past medical history: Wisdom teeth extracted x4 at approximately age 29.  History of hepatitis A around 1982.  History of fractured left arm at age 32.  He had a virtual colonoscopy in May 2009.  Needs to have repeat study and would recommend regular colonoscopy.  Social history: He is married.  Wife is a retired Pharmacist, hospital.  2 adult daughters.  He is retired Water engineer.  Non-smoker.  Social alcohol consumption.  Family history: Father died of an MI.  Mother passed away of complications of septicemia in her late 34s with history of dementia congestive heart failure and vision loss and.  One brother in good health.  3 sisters- one sister died of cancer.  2 sisters living 1 of whom has hypertension.    Review of Systems no new complaints     Objective:   Physical Exam Vitals  signs reviewed.  Constitutional:      Appearance: Normal appearance.  HENT:     Head: Normocephalic.     Right Ear: Tympanic membrane normal.     Left Ear: Tympanic membrane normal.     Nose: Nose normal.     Mouth/Throat:     Mouth: Mucous membranes are moist.  Eyes:     Extraocular Movements: Extraocular movements intact.     Conjunctiva/sclera: Conjunctivae normal.     Pupils: Pupils are equal, round, and reactive to light.  Neck:     Musculoskeletal: Neck supple.     Vascular: No carotid bruit.     Comments: No thyromegaly Cardiovascular:     Rate and Rhythm: Normal rate and regular rhythm.     Pulses: Normal pulses.     Heart sounds: Normal heart sounds. No murmur.  Pulmonary:     Effort: Pulmonary effort is normal. No respiratory distress.     Breath sounds: Normal breath sounds.  Abdominal:     General: Bowel sounds are normal. There is no distension.     Palpations: Abdomen is soft. There is no mass.     Tenderness: There is no abdominal tenderness.  Genitourinary:    Prostate: Normal.  Musculoskeletal:     Right lower leg: No edema.     Left lower leg: No edema.  Lymphadenopathy:     Cervical:  No cervical adenopathy.  Skin:    General: Skin is warm and dry.  Neurological:     General: No focal deficit present.     Mental Status: He is alert and oriented to person, place, and time.     Cranial Nerves: No cranial nerve deficit.     Sensory: No sensory deficit.  Psychiatric:        Mood and Affect: Mood normal.        Behavior: Behavior normal.        Thought Content: Thought content normal.           Assessment & Plan:  Wife has metastatic breast cancer and is doing poorly.  They are going to the beach for a few days.  He is doing all the caretaking himself.  This is stressful.  He has Xanax for anxiety.  Diabetes mellitus-control could be better.  He is on Metformin and does not want to take any other medication.  Hyperlipidemia-elevated  triglycerides.  Does not want to be on statin  Health maintenance-needs colonoscopy but does not want to take the time right now with his wife being so ill.  Flu vaccine given.  History of low HDL cholesterol  Plan: Continue current course and follow-up in 6 months.  Flu vaccine given.  Subjective:   Patient presents for Medicare Annual/Subsequent preventive examination.  Review Past Medical/Family/Social: See above  Risk Factors  Current exercise habits: Not a lot of exercise except the physical activity taking care of his wife Dietary issues discussed: Low-fat low carbohydrate  Cardiac risk factors: Hyperlipidemia and diabetes mellitus and family history  Depression Screen  (Note: if answer to either of the following is "Yes", a more complete depression screening is indicated)   Over the past two weeks, have you felt down, depressed or hopeless? No  Over the past two weeks, have you felt little interest or pleasure in doing things? No Have you lost interest or pleasure in daily life? No Do you often feel hopeless? No Do you cry easily over simple problems? No   Activities of Daily Living  In your present state of health, do you have any difficulty performing the following activities?:   Driving? No  Managing money? No  Feeding yourself? No  Getting from bed to chair? No  Climbing a flight of stairs? No  Preparing food and eating?: No  Bathing or showering? No  Getting dressed: No  Getting to the toilet? No  Using the toilet:No  Moving around from place to place: No  In the past year have you fallen or had a near fall?:No  Are you sexually active? No  Do you have more than one partner? No   Hearing Difficulties: No  Do you often ask people to speak up or repeat themselves? No  Do you experience ringing or noises in your ears? No  Do you have difficulty understanding soft or whispered voices? No  Do you feel that you have a problem with memory? No Do you often  misplace items? No    Home Safety:  Do you have a smoke alarm at your residence? Yes Do you have grab bars in the bathroom?  Yes Do you have throw rugs in your house?  Yes   Cognitive Testing  Alert? Yes Normal Appearance?Yes  Oriented to person? Yes Place? Yes  Time? Yes  Recall of three objects? Yes  Can perform simple calculations? Yes  Displays appropriate judgment?Yes  Can read the correct time  from a watch face?Yes   List the Names of Other Physician/Practitioners you currently use:  See referral list for the physicians patient is currently seeing.     Review of Systems: See above   Objective:     General appearance: Appears younger than stated age  Head: Normocephalic, without obvious abnormality, atraumatic  Eyes: conj clear, EOMi PEERLA  Ears: normal TM's and external ear canals both ears  Nose: Nares normal. Septum midline. Mucosa normal. No drainage or sinus tenderness.  Throat: lips, mucosa, and tongue normal; teeth and gums normal  Neck: no adenopathy, no carotid bruit, no JVD, supple, symmetrical, trachea midline and thyroid not enlarged, symmetric, no tenderness/mass/nodules  No CVA tenderness.  Lungs: clear to auscultation bilaterally  Breasts: normal appearance, no masses  Heart: regular rate and rhythm, S1, S2 normal, no murmur, click, rub or gallop  Abdomen: soft, non-tender; bowel sounds normal; no masses, no organomegaly  Musculoskeletal: ROM normal in all joints, no crepitus, no deformity, Normal muscle strengthen. Back  is symmetric, no curvature. Skin: Skin color, texture, turgor normal. No rashes or lesions  Lymph nodes: Cervical, supraclavicular, and axillary nodes normal.  Neurologic: CN 2 -12 Normal, Normal symmetric reflexes. Normal coordination and gait  Psych: Alert & Oriented x 3, Mood appear stable.    Assessment:    Annual wellness medicare exam   Plan:    During the course of the visit the patient was educated and counseled  about appropriate screening and preventive services including:   Annual flu vaccine  Needs colonoscopy     Patient Instructions (the written plan) was given to the patient.  Medicare Attestation  I have personally reviewed:  The patient's medical and social history  Their use of alcohol, tobacco or illicit drugs  Their current medications and supplements  The patient's functional ability including ADLs,fall risks, home safety risks, cognitive, and hearing and visual impairment  Diet and physical activities  Evidence for depression or mood disorders  The patient's weight, height, BMI, and visual acuity have been recorded in the chart. I have made referrals, counseling, and provided education to the patient based on review of the above and I have provided the patient with a written personalized care plan for preventive services.

## 2018-11-09 NOTE — Addendum Note (Signed)
Addended by: Mady Haagensen on: 11/09/2018 10:13 AM   Modules accepted: Orders

## 2018-11-19 ENCOUNTER — Encounter: Payer: Self-pay | Admitting: Internal Medicine

## 2018-11-19 NOTE — Patient Instructions (Signed)
Continue to work on diet as best you can during this time of stress.  No change in medications.  Flu vaccine given today.  Follow-up in 6 months.  Please call if you need anything.  We are thinking about you.

## 2019-05-07 ENCOUNTER — Other Ambulatory Visit: Payer: Self-pay

## 2019-05-07 ENCOUNTER — Other Ambulatory Visit: Payer: Medicare Other | Admitting: Internal Medicine

## 2019-05-07 DIAGNOSIS — E119 Type 2 diabetes mellitus without complications: Secondary | ICD-10-CM

## 2019-05-07 DIAGNOSIS — E1169 Type 2 diabetes mellitus with other specified complication: Secondary | ICD-10-CM

## 2019-05-07 DIAGNOSIS — E781 Pure hyperglyceridemia: Secondary | ICD-10-CM

## 2019-05-08 ENCOUNTER — Other Ambulatory Visit: Payer: Medicare Other | Admitting: Internal Medicine

## 2019-05-08 ENCOUNTER — Encounter: Payer: Self-pay | Admitting: Internal Medicine

## 2019-05-08 ENCOUNTER — Ambulatory Visit (INDEPENDENT_AMBULATORY_CARE_PROVIDER_SITE_OTHER): Payer: Medicare Other | Admitting: Internal Medicine

## 2019-05-08 ENCOUNTER — Other Ambulatory Visit: Payer: Self-pay

## 2019-05-08 VITALS — BP 130/80 | HR 62 | Temp 98.0°F | Ht 70.5 in | Wt 217.0 lb

## 2019-05-08 DIAGNOSIS — Z634 Disappearance and death of family member: Secondary | ICD-10-CM

## 2019-05-08 DIAGNOSIS — E119 Type 2 diabetes mellitus without complications: Secondary | ICD-10-CM

## 2019-05-08 DIAGNOSIS — R7309 Other abnormal glucose: Secondary | ICD-10-CM | POA: Diagnosis not present

## 2019-05-08 LAB — HEMOGLOBIN A1C
Hgb A1c MFr Bld: 8.9 % of total Hgb — ABNORMAL HIGH (ref <5.7)
Mean Plasma Glucose: 209 (calc)
eAG (mmol/L): 11.6 (calc)

## 2019-05-08 LAB — LIPID PANEL
Cholesterol: 165 mg/dL (ref <200)
HDL: 40 mg/dL (ref >=40)
LDL Cholesterol (Calc): 104 mg/dL (calc) — ABNORMAL HIGH
Non-HDL Cholesterol (Calc): 125 mg/dL (calc) (ref <130)
Total CHOL/HDL Ratio: 4.1 (calc) (ref <5.0)
Triglycerides: 113 mg/dL (ref <150)

## 2019-05-08 NOTE — Progress Notes (Signed)
   Subjective:    Patient ID: Steven Wheeler, male    DOB: 1950-12-15, 69 y.o.   MRN: [id number]  HPI 69 year old Male for follow up on DM.  Patient's wife passed away from complications of metastatic breast cancer in December 2020.  Has been spending his time around the home with some remodeling.  Seems to be doing well although he clearly misses his wife.  They were very close and had a good relationship.  He is sleeping fairly well.  Unfortunately his hemoglobin A1c has increased to 8.9% from 7.7% in September.  He is on Glucophage 500 mg twice daily.  Feels that he probably has not been following a strict diabetic diet.  He is getting some exercise with his activities about the home.    Review of Systems walking 3 miles a day. Sleeping better.     Objective:   Physical Exam Weight gain 4 pounds since Fall 2020.  Weight is 217 pounds.  BMI 30.70.  Blood pressure 130/80.  Pulse 62.  Pulse oximetry 98%.  Temperature 98 degrees orally.  Skin warm and dry.  No cervical adenopathy.  No thyromegaly.  No carotid bruits.  Chest clear to auscultation.  Cardiac exam regular rate and rhythm normal S1 and S2.  No lower extremity edema.      Assessment & Plan:  Type 2 diabetes mellitus treated with Metformin alone.  Hemoglobin A1c has increased to 8.9%.  Patient has not been watching diet as closely as he should due to the illness of his wife and subsequent bereavement after her passing.  He does not want to add additional medication at this point in time.  Continue current medication and follow-up in late April.  25 minutes spent with patient today.

## 2019-05-10 ENCOUNTER — Ambulatory Visit: Payer: Medicare Other | Admitting: Internal Medicine

## 2019-05-23 ENCOUNTER — Encounter: Payer: Self-pay | Admitting: Internal Medicine

## 2019-05-23 NOTE — Patient Instructions (Signed)
Encourage dietary restraint.  Continue current medications.  Follow-up in late April.  Our condolences to you in the loss of your wife recently.

## 2019-06-18 ENCOUNTER — Other Ambulatory Visit: Payer: Self-pay

## 2019-06-18 ENCOUNTER — Other Ambulatory Visit: Payer: Medicare Other | Admitting: Internal Medicine

## 2019-06-18 DIAGNOSIS — E119 Type 2 diabetes mellitus without complications: Secondary | ICD-10-CM | POA: Diagnosis not present

## 2019-06-19 ENCOUNTER — Other Ambulatory Visit: Payer: Self-pay

## 2019-06-19 ENCOUNTER — Ambulatory Visit (INDEPENDENT_AMBULATORY_CARE_PROVIDER_SITE_OTHER): Payer: Medicare Other | Admitting: Internal Medicine

## 2019-06-19 ENCOUNTER — Encounter: Payer: Self-pay | Admitting: Internal Medicine

## 2019-06-19 VITALS — BP 110/80 | HR 70 | Temp 97.8°F | Ht 70.5 in | Wt 220.0 lb

## 2019-06-19 DIAGNOSIS — E119 Type 2 diabetes mellitus without complications: Secondary | ICD-10-CM

## 2019-06-19 LAB — HEMOGLOBIN A1C
Hgb A1c MFr Bld: 8.8 % of total Hgb — ABNORMAL HIGH (ref <5.7)
Mean Plasma Glucose: 206 (calc)
eAG (mmol/L): 11.4 (calc)

## 2019-06-19 NOTE — Progress Notes (Signed)
   Subjective:    Patient ID: Steven Wheeler, male    DOB: April 19, 1950, 69 y.o.   MRN: [id number]  HPI 69 year old Male for follow up on type 2 Diabetes mellitus.  He is trying to watch his diet.  He has been very physically active about his home.  Sometimes he does not stop to eat lunch if he is working outside.  At last visit in March hemoglobin A1c was 8.9%.  It is now 8.8%.  Spoke with him about adding more medication for diabetes mellitus but he really does not want to do that.  He agrees to return for his health maintenance exam in September and if A1c is not improved he will consider more glucose lowering medication.  His daughter will be moving in with him soon along with a grandchild.  She is getting a divorce and will be moving from out of town.  She is a Pharmacist, hospital.  He is looking forward to this.  Continuing to adjust to losing his wife to metastatic breast cancer.  She passed away in 2023-02-26.  He is adjusting well.    Review of Systems see above     Objective:   Physical Exam  Blood pressure 110/80 pulse 70 temperature 97.8 degrees orally pulse oximetry 98% weight 220 pounds BMI 31.12  Seen today in no acute distress.  Reviewed with him hemoglobin A1c reading with this visit and previous visit in March.  Dietary issues discussed.  He is getting plenty of exercise.  He currently is on Metformin.      Assessment & Plan:  Type 2 diabetes mellitus-would like to see control improved a bit.  Currently on Metformin.  He will return in September for health maintenance exam and Medicare wellness visit.  If not improving with hemoglobin A1c reading, he will consider more glucose lowering medication at that time.

## 2019-06-19 NOTE — Patient Instructions (Signed)
Continue Metformin as previously prescribed for diabetes mellitus.  A1c has not improved substantially.  Continue diet and exercise regimen.  Follow-up for health maintenance exam and Medicare wellness visit in September with repeat hemoglobin A1c.  Patient does not want to add more medication at this point in time.

## 2019-07-01 ENCOUNTER — Other Ambulatory Visit: Payer: Self-pay | Admitting: Internal Medicine

## 2019-11-01 ENCOUNTER — Telehealth: Payer: Self-pay | Admitting: Internal Medicine

## 2019-11-01 NOTE — Telephone Encounter (Signed)
Pt said he got into poison ivy and wanted to know if you could send something in for him. He said he would like to try and have it gone before the appt with you later this month. Please advise. Pt has used calamine lotion

## 2019-11-01 NOTE — Telephone Encounter (Signed)
Scheduled OV tomorrow.

## 2019-11-01 NOTE — Telephone Encounter (Signed)
If he wants oral prednisone, he needs OV. Otherwise Calamine lotion and Benadryl. How severe is it?

## 2019-11-02 ENCOUNTER — Ambulatory Visit: Payer: Medicare Other | Admitting: Internal Medicine

## 2019-11-12 ENCOUNTER — Other Ambulatory Visit: Payer: Medicare Other | Admitting: Internal Medicine

## 2019-11-12 ENCOUNTER — Other Ambulatory Visit: Payer: Self-pay

## 2019-11-12 DIAGNOSIS — E119 Type 2 diabetes mellitus without complications: Secondary | ICD-10-CM | POA: Diagnosis not present

## 2019-11-12 DIAGNOSIS — E7800 Pure hypercholesterolemia, unspecified: Secondary | ICD-10-CM

## 2019-11-12 DIAGNOSIS — E786 Lipoprotein deficiency: Secondary | ICD-10-CM | POA: Diagnosis not present

## 2019-11-12 DIAGNOSIS — Z125 Encounter for screening for malignant neoplasm of prostate: Secondary | ICD-10-CM | POA: Diagnosis not present

## 2019-11-12 DIAGNOSIS — F439 Reaction to severe stress, unspecified: Secondary | ICD-10-CM | POA: Diagnosis not present

## 2019-11-12 DIAGNOSIS — Z0000 Encounter for general adult medical examination without abnormal findings: Secondary | ICD-10-CM

## 2019-11-12 DIAGNOSIS — F419 Anxiety disorder, unspecified: Secondary | ICD-10-CM | POA: Diagnosis not present

## 2019-11-13 ENCOUNTER — Encounter: Payer: Self-pay | Admitting: Internal Medicine

## 2019-11-13 ENCOUNTER — Ambulatory Visit (INDEPENDENT_AMBULATORY_CARE_PROVIDER_SITE_OTHER): Payer: Medicare Other | Admitting: Internal Medicine

## 2019-11-13 VITALS — BP 140/70 | HR 78 | Ht 70.5 in | Wt 219.0 lb

## 2019-11-13 DIAGNOSIS — E119 Type 2 diabetes mellitus without complications: Secondary | ICD-10-CM

## 2019-11-13 DIAGNOSIS — Z23 Encounter for immunization: Secondary | ICD-10-CM | POA: Diagnosis not present

## 2019-11-13 DIAGNOSIS — R7309 Other abnormal glucose: Secondary | ICD-10-CM | POA: Diagnosis not present

## 2019-11-13 DIAGNOSIS — Z0000 Encounter for general adult medical examination without abnormal findings: Secondary | ICD-10-CM | POA: Diagnosis not present

## 2019-11-13 LAB — COMPLETE METABOLIC PANEL WITH GFR
AG Ratio: 1.6 (calc) (ref 1.0–2.5)
ALT: 16 U/L (ref 9–46)
AST: 22 U/L (ref 10–35)
Albumin: 4 g/dL (ref 3.6–5.1)
Alkaline phosphatase (APISO): 74 U/L (ref 35–144)
BUN: 13 mg/dL (ref 7–25)
CO2: 27 mmol/L (ref 20–32)
Calcium: 9.2 mg/dL (ref 8.6–10.3)
Chloride: 102 mmol/L (ref 98–110)
Creat: 0.93 mg/dL (ref 0.70–1.18)
GFR, Est African American: 96 mL/min/{1.73_m2} (ref >=60)
GFR, Est Non African American: 83 mL/min/{1.73_m2} (ref >=60)
Globulin: 2.5 g/dL (calc) (ref 1.9–3.7)
Glucose, Bld: 205 mg/dL — ABNORMAL HIGH (ref 65–99)
Potassium: 4.7 mmol/L (ref 3.5–5.3)
Sodium: 137 mmol/L (ref 135–146)
Total Bilirubin: 0.5 mg/dL (ref 0.2–1.2)
Total Protein: 6.5 g/dL (ref 6.1–8.1)

## 2019-11-13 LAB — POCT URINALYSIS DIPSTICK
Appearance: NEGATIVE
Bilirubin, UA: NEGATIVE
Blood, UA: NEGATIVE
Glucose, UA: NEGATIVE
Ketones, UA: NEGATIVE
Leukocytes, UA: NEGATIVE
Nitrite, UA: NEGATIVE
Odor: NEGATIVE
Protein, UA: NEGATIVE
Spec Grav, UA: 1.01 (ref 1.010–1.025)
Urobilinogen, UA: 0.2 E.U./dL
pH, UA: 6.5 (ref 5.0–8.0)

## 2019-11-13 LAB — LIPID PANEL
Cholesterol: 130 mg/dL (ref <200)
HDL: 42 mg/dL (ref >=40)
LDL Cholesterol (Calc): 69 mg/dL (calc)
Non-HDL Cholesterol (Calc): 88 mg/dL (calc) (ref <130)
Total CHOL/HDL Ratio: 3.1 (calc) (ref <5.0)
Triglycerides: 100 mg/dL (ref <150)

## 2019-11-13 LAB — CBC WITH DIFFERENTIAL/PLATELET
Absolute Monocytes: 559 cells/uL (ref 200–950)
Basophils Absolute: 51 cells/uL (ref 0–200)
Basophils Relative: 0.9 %
Eosinophils Absolute: 217 cells/uL (ref 15–500)
Eosinophils Relative: 3.8 %
HCT: 41.7 % (ref 38.5–50.0)
Hemoglobin: 13.7 g/dL (ref 13.2–17.1)
Lymphs Abs: 1835 cells/uL (ref 850–3900)
MCH: 29 pg (ref 27.0–33.0)
MCHC: 32.9 g/dL (ref 32.0–36.0)
MCV: 88.3 fL (ref 80.0–100.0)
MPV: 10.5 fL (ref 7.5–12.5)
Monocytes Relative: 9.8 %
Neutro Abs: 3038 cells/uL (ref 1500–7800)
Neutrophils Relative %: 53.3 %
Platelets: 223 10*3/uL (ref 140–400)
RBC: 4.72 10*6/uL (ref 4.20–5.80)
RDW: 11.5 % (ref 11.0–15.0)
Total Lymphocyte: 32.2 %
WBC: 5.7 10*3/uL (ref 3.8–10.8)

## 2019-11-13 LAB — HEMOGLOBIN A1C
Hgb A1c MFr Bld: 8.9 % of total Hgb — ABNORMAL HIGH (ref <5.7)
Mean Plasma Glucose: 209 (calc)
eAG (mmol/L): 11.6 (calc)

## 2019-11-13 LAB — MICROALBUMIN / CREATININE URINE RATIO
Creatinine, Urine: 115 mg/dL (ref 20–320)
Microalb, Ur: <0.2 mg/dL

## 2019-11-13 LAB — PSA: PSA: 0.47 ng/mL (ref <=4.0)

## 2019-11-13 NOTE — Patient Instructions (Signed)
Keep accuchecks before breakfast and dinner for 4 days and record of what you are eating. RTC in 2 weeks after your beach trip I.e. early October. We may need to adjust diabetic medication. Generally 1800-2000 calories are required daily. Flu vaccine given.

## 2019-11-13 NOTE — Progress Notes (Signed)
Subjective:    Patient ID: Steven Wheeler, male    DOB: 1951-01-14, 69 y.o.   MRN: [id number]  HPI 69 year old Male for Medicare wellness,  health maintenance exam, and evaluation of medical issues.  He has a history of type 2 diabetes mellitus, hypertriglyceridemia, borderline hypertension, metabolic syndrome.  His wife passed away from metastatic breast cancer late last year.  His daughter has moved in with him and that is working out nicely.  She is a Pharmacist, hospital.  She has a small child.  He loves spending time with his grandchild.  He says he only eats 1 meal a day which probably is not a good idea with history of diabetes mellitus.  His hemoglobin A1c is 8.9%.  Sometimes he is eating [employer] with fries because that is what his daughter would like to eat.  His fasting glucose is 205.  His BUN and creatinine are normal.  His lipids are normal.  He currently is only on Glucophage 500 mg twice daily.  He has been advised to keep Accu-Cheks before breakfast and dinner for 4 days and record what he is eating.  He will return in 2 weeks after his beach trip i.e. in early October.  Flu vaccine has been given today.  He may need an adjustment in his diabetic medication.  Past medical history: He had a virtual colonoscopy in May 2009.  He needs a repeat study and I would recommend a regular colonoscopy.  He had wisdom teeth extracted x4 at approximately age 68.  History of hepatitis A around 1982.  History of fractured left arm at age 8.  Has not wanted to be on statin medication.  Social history: He has 2 adult daughters 1 of whom resides with him.  He is a retired Water engineer.  Non-smoker.  Social alcohol consumption.  Family history: Father died of an MI.  Mother passed away of complications of septicemia in her late 49s with history of dementia, congestive heart failure and vision loss.  1 brother in good health.  3 sisters, 1 died of cancer.  2 sisters living 1 of whom has  hypertension.    Review of Systems  Constitutional: Negative.   Respiratory: Negative.   Cardiovascular: Negative.   Gastrointestinal: Negative.   Genitourinary: Negative.   Musculoskeletal: Negative.   Neurological: Negative.   Psychiatric/Behavioral: Negative.        Objective:   Physical Exam Blood pressure 140/70 pulse 78 pulse oximetry 97% weight 219 pounds BMI 30.98  Skin warm and dry.  No cervical adenopathy.  No carotid bruits.  TMs are clear.  Neck is supple.  Chest is clear to auscultation.  Cardiac exam regular rate and rhythm normal S1 and S2 without murmurs or gallops.  Abdomen soft nondistended without hepatosplenomegaly masses or tenderness.  Prostate is normal.  No lower extremity edema.  Diabetic foot exam is normal without calluses or lesions.  Pulses are intact.  Sensation is intact.  Neurological: Intact without focal deficits.  Affect thought and judgment are normal.       Assessment & Plan:   Poorly controlled diabetes mellitus.  We talked about this today.  He needs to eat more than 1 meal a day and probably needs to stay away from [place] or at least not eat the [place] fries with a sandwich.  He is going to keep his Accu-Cheks 4 times a day and return in early October after his vacation at the beach.  History of elevated  triglycerides and does not want to be on statin medication  Health maintenance needs to have repeat colonoscopy  History of low HDL cholesterol  Plan: See him again in a couple of weeks with review of his Accu-Cheks and make further recommendations.  Subjective:   Patient presents for Medicare Annual/Subsequent preventive examination.  Review Past Medical/Family/Social:   Risk Factors  Current exercise habits:  Dietary issues discussed:   Cardiac risk factors:  Depression Screen  (Note: if answer to either of the following is "Yes", a more complete depression screening is indicated)   Over the past two weeks, have you  felt down, depressed or hopeless? No  Over the past two weeks, have you felt little interest or pleasure in doing things? No Have you lost interest or pleasure in daily life? No Do you often feel hopeless? No Do you cry easily over simple problems? No   Activities of Daily Living  In your present state of health, do you have any difficulty performing the following activities?:   Driving? No  Managing money? No  Feeding yourself? No  Getting from bed to chair? No  Climbing a flight of stairs? No  Preparing food and eating?: No  Bathing or showering? No  Getting dressed: No  Getting to the toilet? No  Using the toilet:No  Moving around from place to place: No  In the past year have you fallen or had a near fall?:No  Are you sexually active? No  Do you have more than one partner? No   Hearing Difficulties: No  Do you often ask people to speak up or repeat themselves? No  Do you experience ringing or noises in your ears? No  Do you have difficulty understanding soft or whispered voices? No  Do you feel that you have a problem with memory? No Do you often misplace items? No    Home Safety:  Do you have a smoke alarm at your residence? Yes Do you have grab bars in the bathroom? Do you have throw rugs in your house?   Cognitive Testing  Alert? Yes Normal Appearance?Yes  Oriented to person? Yes Place? Yes  Time? Yes  Recall of three objects? Yes  Can perform simple calculations? Yes  Displays appropriate judgment?Yes  Can read the correct time from a watch face?Yes   List the Names of Other Physician/Practitioners you currently use:  See referral list for the physicians patient is currently seeing.     Review of Systems:   Objective:     General appearance: Appears stated age and mildly obese  Head: Normocephalic, without obvious abnormality, atraumatic  Eyes: conj clear, EOMi PEERLA  Ears: normal TM's and external ear canals both ears  Nose: Nares normal. Septum  midline. Mucosa normal. No drainage or sinus tenderness.  Throat: lips, mucosa, and tongue normal; teeth and gums normal  Neck: no adenopathy, no carotid bruit, no JVD, supple, symmetrical, trachea midline and thyroid not enlarged, symmetric, no tenderness/mass/nodules  No CVA tenderness.  Lungs: clear to auscultation bilaterally  Breasts: normal appearance, no masses or tenderness, top of the pacemaker on left upper chest. Incision well-healed. It is tender.  Heart: regular rate and rhythm, S1, S2 normal, no murmur, click, rub or gallop  Abdomen: soft, non-tender; bowel sounds normal; no masses, no organomegaly  Musculoskeletal: ROM normal in all joints, no crepitus, no deformity, Normal muscle strengthen. Back  is symmetric, no curvature. Skin: Skin color, texture, turgor normal. No rashes or lesions  Lymph nodes: Cervical,  supraclavicular, and axillary nodes normal.  Neurologic: CN 2 -12 Normal, Normal symmetric reflexes. Normal coordination and gait  Psych: Alert & Oriented x 3, Mood appear stable.    Assessment:    Annual wellness medicare exam   Plan:    During the course of the visit the patient was educated and counseled about appropriate screening and preventive services including:        Patient Instructions (the written plan) was given to the patient.  Medicare Attestation  I have personally reviewed:  The patient's medical and social history  Their use of alcohol, tobacco or illicit drugs  Their current medications and supplements  The patient's functional ability including ADLs,fall risks, home safety risks, cognitive, and hearing and visual impairment  Diet and physical activities  Evidence for depression or mood disorders  The patient's weight, height, BMI, and visual acuity have been recorded in the chart. I have made referrals, counseling, and provided education to the patient based on review of the above and I have provided the patient with a written  personalized care plan for preventive services.            Subjective:   Patient presents for Medicare Annual/Subsequent preventive examination.  Review Past Medical/Family/Social: See above   Risk Factors  Current exercise habits: Gets a lot of exercise about his property.  Stays busy. Dietary issues discussed: Low-fat low carbohydrate discussed.  Food choices discussed.  Cardiac risk factors: Family history of mother, hypertriglyceridemia, diabetes mellitus  Depression Screen  (Note: if answer to either of the following is "Yes", a more complete depression screening is indicated)   Over the past two weeks, have you felt down, depressed or hopeless? No  Over the past two weeks, have you felt little interest or pleasure in doing things? No Have you lost interest or pleasure in daily life? No Do you often feel hopeless? No Do you cry easily over simple problems? No   Activities of Daily Living  In your present state of health, do you have any difficulty performing the following activities?:   Driving? No  Managing money? No  Feeding yourself? No  Getting from bed to chair? No  Climbing a flight of stairs? No  Preparing food and eating?: No  Bathing or showering? No  Getting dressed: No  Getting to the toilet? No  Using the toilet:No  Moving around from place to place: No  In the past year have you fallen or had a near fall?:No  Are you sexually active? No  Do you have more than one partner? No   Hearing Difficulties: No  Do you often ask people to speak up or repeat themselves? No  Do you experience ringing or noises in your ears? No  Do you have difficulty understanding soft or whispered voices? No  Do you feel that you have a problem with memory? No Do you often misplace items? No    Home Safety:  Do you have a smoke alarm at your residence? Yes Do you have grab bars in the bathroom?  Yes Do you have throw rugs in your house?  None   Cognitive Testing    Alert? Yes Normal Appearance?Yes  Oriented to person? Yes Place? Yes  Time? Yes  Recall of three objects? Yes  Can perform simple calculations? Yes  Displays appropriate judgment?Yes  Can read the correct time from a watch face?Yes   List the Names of Other Physician/Practitioners you currently use:  See referral list for the  physicians patient is currently seeing.     Review of Systems: See above   Objective:     General appearance: Appears younger than stated age Head: Normocephalic, without obvious abnormality, atraumatic  Eyes: conj clear, EOMi PEERLA  Ears: normal TM's and external ear canals both ears  Nose: Nares normal. Septum midline. Mucosa normal. No drainage or sinus tenderness.  Throat: lips, mucosa, and tongue normal; teeth and gums normal  Neck: no adenopathy, no carotid bruit, no JVD, supple, symmetrical, trachea midline and thyroid not enlarged, symmetric, no tenderness/mass/nodules  No CVA tenderness.  Lungs: clear to auscultation bilaterally  Breasts: normal appearance Heart: regular rate and rhythm, S1, S2 normal, no murmur, click, rub or gallop  Abdomen: soft, non-tender; bowel sounds normal; no masses, no organomegaly  Musculoskeletal: ROM normal in all joints, no crepitus, no deformity, Normal muscle strengthen. Back  is symmetric, no curvature. Skin: Skin color, texture, turgor normal. No rashes or lesions  Lymph nodes: Cervical, supraclavicular, and axillary nodes normal.  Neurologic: CN 2 -12 Normal, Normal symmetric reflexes. Normal coordination and gait  Psych: Alert & Oriented x 3, Mood appear stable.    Assessment:    Annual wellness medicare exam   Plan:    During the course of the visit the patient was educated and counseled about appropriate screening and preventive services including:   Annual flu vaccine  Needs colonoscopy  Can have COVID-19 booster soon  Tetanus immunization and pneumonia vaccines are  up-to-date     Patient Instructions (the written plan) was given to the patient.  Medicare Attestation  I have personally reviewed:  The patient's medical and social history  Their use of alcohol, tobacco or illicit drugs  Their current medications and supplements  The patient's functional ability including ADLs,fall risks, home safety risks, cognitive, and hearing and visual impairment  Diet and physical activities  Evidence for depression or mood disorders  The patient's weight, height, BMI, and visual acuity have been recorded in the chart. I have made referrals, counseling, and provided education to the patient based on review of the above and I have provided the patient with a written personalized care plan for preventive services.

## 2019-11-14 LAB — MICROALBUMIN / CREATININE URINE RATIO
Creatinine, Urine: 165 mg/dL (ref 20–320)
Microalb Creat Ratio: 2 mcg/mg creat (ref <30)
Microalb, Ur: 0.4 mg/dL

## 2019-11-27 DIAGNOSIS — Z23 Encounter for immunization: Secondary | ICD-10-CM | POA: Diagnosis not present

## 2019-11-29 ENCOUNTER — Ambulatory Visit (INDEPENDENT_AMBULATORY_CARE_PROVIDER_SITE_OTHER): Payer: Medicare Other | Admitting: Internal Medicine

## 2019-11-29 ENCOUNTER — Encounter: Payer: Self-pay | Admitting: Internal Medicine

## 2019-11-29 ENCOUNTER — Other Ambulatory Visit: Payer: Self-pay

## 2019-11-29 VITALS — BP 110/70 | HR 85 | Ht 70.5 in | Wt 210.0 lb

## 2019-11-29 DIAGNOSIS — R7309 Other abnormal glucose: Secondary | ICD-10-CM | POA: Diagnosis not present

## 2019-11-29 DIAGNOSIS — E119 Type 2 diabetes mellitus without complications: Secondary | ICD-10-CM | POA: Diagnosis not present

## 2019-11-29 NOTE — Progress Notes (Signed)
   Subjective:    Patient ID: Steven Wheeler, male    DOB: May 11, 1950, 69 y.o.   MRN: [id number]  HPI  69 year old Male for follow up of follow-up on Diabetes mellitus.  He was here in September for health maintenance exam.  He was only eating 1 meal a day.  He was sometimes eating [employer] in late afternoon as a snack.  He was going to the beach and was advised to keep Accu-Cheks 4 times a day.  He is now here for follow-up.  At last visit on September 21 hemoglobin A1c was 8.9%.  He brings in multiple Accu-Chek readings which are now much more acceptable.  It is a bit too early to recheck his hemoglobin A1c at this point.  We will check it again in late November.  He has a better understanding of adequate protein and calories but not too many carbohydrates.    Review of Systems lost 9 pounds since last visit Sept 21st     Objective:   Physical Exam BP 110/70  Weight 29.71 pulse ox 97% Chest clear to auscultation.  Cardiac exam regular rate and rhythm.      Assessment & Plan:  Congratulated on 9 pound weight loss since visit of September 21.  He is eating better and more frequently which is good.  Continue current diet.  We will follow-up in November with hemoglobin A1c.  No change in medication regimen at this time.

## 2019-12-22 NOTE — Patient Instructions (Signed)
Accu-Cheks look much better.  Continue to eating more regularly and less high calorie foods.  Follow-up in November.

## 2020-01-14 ENCOUNTER — Other Ambulatory Visit: Payer: Self-pay

## 2020-01-14 ENCOUNTER — Other Ambulatory Visit: Payer: Medicare Other | Admitting: Internal Medicine

## 2020-01-14 DIAGNOSIS — E119 Type 2 diabetes mellitus without complications: Secondary | ICD-10-CM | POA: Diagnosis not present

## 2020-01-15 ENCOUNTER — Ambulatory Visit (INDEPENDENT_AMBULATORY_CARE_PROVIDER_SITE_OTHER): Payer: Medicare Other | Admitting: Internal Medicine

## 2020-01-15 ENCOUNTER — Encounter: Payer: Self-pay | Admitting: Internal Medicine

## 2020-01-15 VITALS — BP 130/60 | HR 65 | Ht 70.5 in | Wt 202.0 lb

## 2020-01-15 DIAGNOSIS — E786 Lipoprotein deficiency: Secondary | ICD-10-CM | POA: Diagnosis not present

## 2020-01-15 DIAGNOSIS — E119 Type 2 diabetes mellitus without complications: Secondary | ICD-10-CM

## 2020-01-15 LAB — HEMOGLOBIN A1C
Hgb A1c MFr Bld: 8.1 % of total Hgb — ABNORMAL HIGH (ref <5.7)
Mean Plasma Glucose: 186 (calc)
eAG (mmol/L): 10.3 (calc)

## 2020-01-15 NOTE — Progress Notes (Signed)
   Subjective:    Patient ID: Steven Wheeler, male    DOB: March 08, 1950, 69 y.o.   MRN: [id number]  HPI 69 year old Male seen for follow up on type II Diabetes mellitus.  His hemoglobin A1c in September was 8.9% and is now 8.1%.  He was eating [employer] in September at around 3 PM.  He really doesn't eat but 1 meal a day or so.  I'm concerned he probably doesn't actually eat enough.  I asked him to see dietitian but he would like to continue to work on this with his own method.  He is on Metformin 500 mg twice daily.  Doesn't want to take additional medication.  Realizes that Christmas will be difficult as his wife died a year ago during the holidays.    Review of Systems no new complaints     Objective:   Physical Exam  Blood pressure is 130/60 pulse 65 pulse oximetry 97% weight 202 pounds BMI 28.57 neck supple.  Chest clear to auscultation.  Cardiac exam regular rate and rhythm.  Extremities without edema.      Assessment & Plan:  Type 2 diabetes mellitus-could be under better control.  He does not want to add additional medication at this time.  Plan: We have agreed he will follow-up in February after the holidays.  He will have fasting labs at the time and we will see where we are with diabetic control and lipid management.

## 2020-01-15 NOTE — Patient Instructions (Addendum)
Patient will return in February for follow-up on lipids and diabetes with fasting labs before office visit.

## 2020-02-22 ENCOUNTER — Encounter: Payer: Self-pay | Admitting: Internal Medicine

## 2020-04-03 LAB — HM DIABETES EYE EXAM

## 2020-04-14 ENCOUNTER — Other Ambulatory Visit: Payer: Medicare Other | Admitting: Internal Medicine

## 2020-04-14 DIAGNOSIS — R7309 Other abnormal glucose: Secondary | ICD-10-CM

## 2020-04-14 DIAGNOSIS — E786 Lipoprotein deficiency: Secondary | ICD-10-CM

## 2020-04-14 DIAGNOSIS — E119 Type 2 diabetes mellitus without complications: Secondary | ICD-10-CM

## 2020-04-14 DIAGNOSIS — E781 Pure hyperglyceridemia: Secondary | ICD-10-CM

## 2020-04-15 ENCOUNTER — Other Ambulatory Visit: Payer: Self-pay

## 2020-04-15 ENCOUNTER — Other Ambulatory Visit: Payer: Medicare HMO | Admitting: Internal Medicine

## 2020-04-15 DIAGNOSIS — E1169 Type 2 diabetes mellitus with other specified complication: Secondary | ICD-10-CM

## 2020-04-15 DIAGNOSIS — F439 Reaction to severe stress, unspecified: Secondary | ICD-10-CM

## 2020-04-15 DIAGNOSIS — Z0000 Encounter for general adult medical examination without abnormal findings: Secondary | ICD-10-CM

## 2020-04-15 DIAGNOSIS — F411 Generalized anxiety disorder: Secondary | ICD-10-CM

## 2020-04-15 DIAGNOSIS — R7309 Other abnormal glucose: Secondary | ICD-10-CM

## 2020-04-15 DIAGNOSIS — E119 Type 2 diabetes mellitus without complications: Secondary | ICD-10-CM

## 2020-04-15 DIAGNOSIS — E781 Pure hyperglyceridemia: Secondary | ICD-10-CM | POA: Diagnosis not present

## 2020-04-17 ENCOUNTER — Ambulatory Visit (INDEPENDENT_AMBULATORY_CARE_PROVIDER_SITE_OTHER): Payer: Medicare HMO | Admitting: Internal Medicine

## 2020-04-17 ENCOUNTER — Encounter: Payer: Self-pay | Admitting: Internal Medicine

## 2020-04-17 ENCOUNTER — Other Ambulatory Visit: Payer: Self-pay

## 2020-04-17 VITALS — BP 120/80 | HR 77 | Ht 70.5 in | Wt 209.0 lb

## 2020-04-17 DIAGNOSIS — E119 Type 2 diabetes mellitus without complications: Secondary | ICD-10-CM | POA: Diagnosis not present

## 2020-04-17 DIAGNOSIS — E786 Lipoprotein deficiency: Secondary | ICD-10-CM | POA: Diagnosis not present

## 2020-04-17 LAB — HEMOGLOBIN A1C
Hgb A1c MFr Bld: 8.5 % of total Hgb — ABNORMAL HIGH (ref <5.7)
Mean Plasma Glucose: 197 mg/dL
eAG (mmol/L): 10.9 mmol/L

## 2020-04-17 LAB — LIPID PANEL
Cholesterol: 111 mg/dL (ref <200)
HDL: 29 mg/dL — ABNORMAL LOW (ref >=40)
LDL Cholesterol (Calc): 65 mg/dL (calc)
Non-HDL Cholesterol (Calc): 82 mg/dL (calc) (ref <130)
Total CHOL/HDL Ratio: 3.8 (calc) (ref <5.0)
Triglycerides: 90 mg/dL (ref <150)

## 2020-04-17 LAB — COMPLETE METABOLIC PANEL WITH GFR
AG Ratio: 1.6 (calc) (ref 1.0–2.5)
ALT: 17 U/L (ref 9–46)
AST: 20 U/L (ref 10–35)
Albumin: 4.3 g/dL (ref 3.6–5.1)
Alkaline phosphatase (APISO): 79 U/L (ref 35–144)
BUN: 12 mg/dL (ref 7–25)
CO2: 25 mmol/L (ref 20–32)
Calcium: 9.4 mg/dL (ref 8.6–10.3)
Chloride: 103 mmol/L (ref 98–110)
Creat: 0.95 mg/dL (ref 0.70–1.18)
GFR, Est African American: 94 mL/min/{1.73_m2} (ref >=60)
GFR, Est Non African American: 81 mL/min/{1.73_m2} (ref >=60)
Globulin: 2.7 g/dL (calc) (ref 1.9–3.7)
Glucose, Bld: 194 mg/dL — ABNORMAL HIGH (ref 65–99)
Potassium: 4.7 mmol/L (ref 3.5–5.3)
Sodium: 139 mmol/L (ref 135–146)
Total Bilirubin: 0.5 mg/dL (ref 0.2–1.2)
Total Protein: 7 g/dL (ref 6.1–8.1)

## 2020-04-17 LAB — PREALBUMIN: Prealbumin: 24 mg/dL (ref 21–43)

## 2020-04-17 NOTE — Patient Instructions (Signed)
Pleasure to see you today.  We will arrange for Endocrinology consultation to get better control of hemoglobin A1c and diabetes mellitus.  Physical exam due in 6 months.  Restart your walking regimen.  Continue to monitor Accu-Cheks.

## 2020-04-17 NOTE — Progress Notes (Signed)
   Subjective:    Patient ID: Steven Wheeler, male    DOB: [birth year], 70 y.o.   MRN: [id number]  HPI  70 year old Male for follow up on diabetes. Not exercising as much. Weather has not cooperated with walking regimen. Feels well. Hoping to go to [facility] to outdoor car show next month. Wife died from complications of cancer just over a year ago. Daughter and granddaughter have moved in with him. He seems to be doing fairly well emotionally.  However, he has Type 2 diabetes and Hgb AIC has increased to 8.5% and was 8.1 & 3 months ago. 2 years ago was 7.5%.He has been reluctant to try additional medication in the past. Currently just on Metformin 500 mg twice a day. He had hoped to get by with diet and exercise and small amt of medication.  Has had 3 Covid vaccines.  Hx of hypertriglyceridemia and lipids are excellent except low HDL of 29. Has been as high as 38 with exercise.  Prealbumin is 24. Wanted to see if he was eating enough protein.  He has taken Krill oil in the past as well as cinnamon.  Review of Systems no new complaints     Objective:   Physical Exam BP 120/80 pulse 77 pulse ox 97% Weight 209 pounds.  BMI is 29.56  Skin is warm and dry.  No cervical adenopathy.  Chest clear.  No carotid bruits.  Cardiac exam regular rate and rhythm normal S1 and S2.  No lower extremity edema.  Affect felt judgment are normal.     Assessment & Plan:  I feel he would benefit from Endocrinology consultation.  He needs medication adjustment and likely Dr. Dwyane Dee can get that done quickly for him.  He agrees to change his diet a bit and start walking again.  We will arrange for Endocrinology consultation.

## 2020-06-05 ENCOUNTER — Other Ambulatory Visit: Payer: Self-pay

## 2020-06-05 ENCOUNTER — Encounter: Payer: Self-pay | Admitting: Endocrinology

## 2020-06-05 ENCOUNTER — Ambulatory Visit: Payer: Medicare HMO | Admitting: Endocrinology

## 2020-06-05 VITALS — BP 138/80 | HR 84 | Ht 70.5 in | Wt 208.0 lb

## 2020-06-05 DIAGNOSIS — E1165 Type 2 diabetes mellitus with hyperglycemia: Secondary | ICD-10-CM | POA: Diagnosis not present

## 2020-06-05 MED ORDER — DAPAGLIFLOZIN PROPANEDIOL 5 MG PO TABS: 5.0000 mg | ORAL_TABLET | Freq: Every day | ORAL | 3 refills | Status: DC

## 2020-06-05 NOTE — Progress Notes (Signed)
Patient ID: Steven Wheeler, male   DOB: 06-04-1950, 70 y.o.   MRN: [id number]           Reason for Appointment: Consultation for Type 2 Diabetes  Referring PCP: Steven Wheeler   History of Present Illness:          Date of diagnosis of type 2 diabetes mellitus:  2017      Background history:  He was asymptomatic at the time of diagnosis with A1c 6.9 He was started on metformin 500 mg twice daily which has been continued unchanged since then Has not been on any other medications  Recent history:   Most recent A1c 8.5 is done on 04/15/2020   Non-insulin hypoglycemic drugs the patient is taking are: Metformin 500 mg twice daily  Current management, blood sugar patterns and problems identified:  He has been referred here because of persistently high A1c over 8%  He has had no significant change in his lifestyle with no change in diet or exercise regimen  He is fairly regular with his walking program  He does have a meter at home to check her sugars but likely has not checked his readings for at least a couple of years  Fasting blood sugars in the lab usually around 190-200s recently          Side effects from medications have been: None     Typical meal intake: Breakfast is cereal or oatmeal, bacon/eggs.  Usually fast food at dinnertime.  Snacks will be fruit, trail mix and popcorn               Exercise:  Walking 3 miles 6 days a week  Glucose monitoring:  Currently not done       Glucometer: Unknown     Blood Glucose readings by time of day and averages from meter download:   Dietician visit, most recent: 2019  Weight history:  Wt Readings from Last 3 Encounters:  06/05/20 208 lb (94.3 kg)  04/17/20 209 lb (94.8 kg)  01/15/20 202 lb (91.6 kg)    Glycemic control:   Lab Results  Component Value Date   HGBA1C 8.5 (H) 04/15/2020   HGBA1C 8.1 (H) 01/14/2020   HGBA1C 8.9 (H) 11/12/2019   Lab Results  Component Value Date   MICROALBUR 0.4 11/13/2019   LDLCALC 65  04/15/2020   CREATININE 0.95 04/15/2020   Lab Results  Component Value Date   MICRALBCREAT 2 11/13/2019    No results found for: FRUCTOSAMINE  No visits with results within 1 Week(s) from this visit.  Latest known visit with results is:  Abstract on 04/17/2020  Component Date Value Ref Range Status  . HM Diabetic Eye Exam 04/03/2020 No Retinopathy  No Retinopathy Final    Allergies as of 06/05/2020   No Known Allergies     Medication List       Accurate as of June 05, 2020  2:54 PM. If you have any questions, ask your nurse or doctor.        aspirin 81 MG EC tablet Take 81 mg by mouth daily.   CINNAMON PO Take by mouth.   co-enzyme Q-10 50 MG capsule Take 50 mg by mouth daily.   dapagliflozin propanediol 5 MG Tabs tablet Commonly known as: Farxiga Take 1 tablet (5 mg total) by mouth daily. Started by: Steven Snare, MD   KRILL OIL OMEGA-3 PO Take by mouth.   metFORMIN 500 MG tablet Commonly known as: GLUCOPHAGE TAKE 1 TABLET BY  MOUTH TWICE A DAY WITH A MEAL   multivitamin tablet Take 1 tablet by mouth daily.   NON FORMULARY Tumeric, berberine?   NON FORMULARY 1 capsule daily at 2 am. Cholestrol complete   VITAMIN D PO Take by mouth.       Allergies: No Known Allergies  No past medical history on file.  Past Surgical History:  Procedure Laterality Date  . WISDOM TOOTH EXTRACTION      Family History  Problem Relation Age of Onset  . Heart disease Father   . Diabetes Father   . Obesity Father   . Cancer Sister     Social History:  reports that he has never smoked. He has quit using smokeless tobacco. He reports current alcohol use. He reports that he does not use drugs.   Review of Systems  Constitutional: Negative for weight gain.  HENT: Negative for trouble swallowing.   Eyes: Negative for blurred vision.  Cardiovascular: Negative for leg swelling.  Gastrointestinal: Negative for nausea.  Endocrine: Negative for fatigue and  polydipsia.  Genitourinary: Negative for nocturia.  Musculoskeletal: Negative for joint pain.  Neurological: Negative for weakness, numbness and tingling.  Psychiatric/Behavioral: Negative for depressed mood.    Lipid history: Has not been on statins.  However he thinks he takes red rice yeast OTC along with Crit-Line    Lab Results  Component Value Date   CHOL 111 04/15/2020   HDL 29 (L) 04/15/2020   LDLCALC 65 04/15/2020   TRIG 90 04/15/2020   CHOLHDL 3.8 04/15/2020           No history of hypertension:   BP Readings from Last 3 Encounters:  06/05/20 138/80  04/17/20 120/80  01/15/20 130/60    Most recent eye exam was in 2/22, normal  Most recent foot exam: 4/22  Currently known complications of diabetes: None  LABS:  No visits with results within 1 Week(s) from this visit.  Latest known visit with results is:  Abstract on 04/17/2020  Component Date Value Ref Range Status  . HM Diabetic Eye Exam 04/03/2020 No Retinopathy  No Retinopathy Final    Physical Examination:  BP 138/80   Pulse 84   Ht 5' 10.5" (1.791 m)   Wt 208 lb (94.3 kg)   SpO2 98%   BMI 29.42 kg/m   GENERAL:  He is averagely built and nourished, no abdominal obesity  HEENT:         Eye exam shows normal external appearance.  Fundus exam deferred Oral exam not indicated   NECK:   There is no lymphadenopathy  Thyroid is not enlarged and no nodules felt.   Carotids are normal to palpation and no bruit heard  LUNGS:         Chest is symmetrical. Lungs are clear to auscultation.Marland Kitchen   HEART:         Heart sounds:  S1 and S2 are normal. No murmur or click heard., no S3 or S4.   ABDOMEN:   There is no distention present. Liver and spleen are not palpable.  No other mass or tenderness present.    NEUROLOGICAL:    Diabetic Foot Exam - Simple   Simple Foot Form Diabetic Foot exam was performed with the following findings: Yes   Visual Inspection No deformities, no ulcerations, no other  skin breakdown bilaterally: Yes Sensation Testing Intact to touch and monofilament testing bilaterally: Yes Pulse Check Posterior Tibialis and Dorsalis pulse intact bilaterally: Yes See comments: Yes Comments Left  posterior tibialis not felt              MUSCULOSKELETAL:  There is no swelling or deformity of the peripheral joints.     EXTREMITIES:     There is no ankle edema.   SKIN:       No rash or lesions of concern.        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 Glucose monitoring: He will need to restart glucose monitoring at home Reminded him that he will need new testing since the steps he had a couple of years ago are likely expired  Patient advised to check readings either fasting or 2 hours after meals at least every other day  . Diabetes education: Patient will need follow-up with the nutritionist for meal planning  . Lifestyle changes: Dietary changes: Less fast food  Exercise regimen: Continue walking  . Therapy changes:  Discussed action of SGLT 2 drugs on lowering glucose by decreasing kidney absorption of glucose, benefits of weight loss and lower blood pressure, possible side effects including candidiasis and dosage regimen   Since Farxiga appears to be covered by his insurance but has already had 5 mg daily Encouraged him to increase his fluid intake with starting this Also  reminded him to not take it if he has any acute illness causing dehydration  . Preventive care needed:   Urine microalbumin annually  Follow-up: 6 weeks    Patient Instructions  1.  Restart blood sugar monitoring at home  Please call us back with the name of your home glucose meter and we will send in a prescription for new test strips  Check blood sugars on waking up about 3 days a week  Also check blood sugars about 2 hours after meals and do this after different meals by rotation  Recommended blood sugar levels on waking up are 90-130 and about 2 hours after meal is 130-160  Please bring your blood sugar monitor to each visit, thank you  2.  Diet: Avoid high fat fast foods and prefer grilled or baked items  3.  FARXIGA: Start with 1 tablet with breakfast May be normal to notice some increased frequency of urination and will need to increase your fluid intake also  4.  Continue metformin    Consultation note has been sent to the referring physician  Steven Wheeler 06/05/2020, 2:54 PM   Note: This office note was prepared with Dragon voice recognition system technology. Any transcriptional errors that result from this process are unintentional.

## 2020-06-05 NOTE — Patient Instructions (Signed)
1.  Restart blood sugar monitoring at home  Please call us back with the name of your home glucose meter and we will send in a prescription for new test strips  Check blood sugars on waking up about 3 days a week  Also check blood sugars about 2 hours after meals and do this after different meals by rotation  Recommended blood sugar levels on waking up are 90-130 and about 2 hours after meal is 130-160  Please bring your blood sugar monitor to each visit, thank you  2.  Diet: Avoid high fat fast foods and prefer grilled or baked items  3.  FARXIGA: Start with 1 tablet with breakfast May be normal to notice some increased frequency of urination and will need to increase your fluid intake also  4.  Continue metformin

## 2020-06-09 ENCOUNTER — Telehealth: Payer: Self-pay | Admitting: Endocrinology

## 2020-06-09 NOTE — Telephone Encounter (Signed)
MEDICATION: Free Style Lite Test Strips  PHARMACY:  [facility]  HAS THE PATIENT CONTACTED THEIR PHARMACY?    IS THIS A 90 DAY SUPPLY :  yes  IS PATIENT OUT OF MEDICATION: yes  IF NOT; HOW MUCH IS LEFT:   LAST APPOINTMENT DATE: @4 /14/2022  NEXT APPOINTMENT DATE:@5 /23/2022  DO WE HAVE YOUR PERMISSION TO LEAVE A DETAILED MESSAGE?: yes  OTHER COMMENTS:    **Let patient know to contact pharmacy at the end of the day to make sure medication is ready. **  ** Please notify patient to allow 48-72 hours to process**  **Encourage patient to contact the pharmacy for refills or they can request refills through [facility]**

## 2020-06-10 ENCOUNTER — Other Ambulatory Visit: Payer: Self-pay | Admitting: *Deleted

## 2020-06-10 MED ORDER — FREESTYLE LITE TEST VI STRP: 1.0000 | ORAL_STRIP | 1 refills | Status: DC | PRN

## 2020-06-10 NOTE — Telephone Encounter (Signed)
Rx sent 

## 2020-06-11 ENCOUNTER — Other Ambulatory Visit: Payer: Self-pay | Admitting: Endocrinology

## 2020-06-11 ENCOUNTER — Other Ambulatory Visit: Payer: Self-pay | Admitting: *Deleted

## 2020-06-11 MED ORDER — FREESTYLE LITE TEST VI STRP: 1.0000 | ORAL_STRIP | 1 refills | Status: DC | PRN

## 2020-06-11 NOTE — Telephone Encounter (Signed)
[facility] re[facility]sent to [facility] per patient request.

## 2020-06-11 NOTE — Telephone Encounter (Signed)
Pt called to ask if the test strips could be resent to the pharmacy below. He states [facility] isn't in line with his insurance and is charging him over $300 for them.   Please send to:  [id number] [address], [place] Phone:  [phone]  Fax:  [phone]

## 2020-06-12 ENCOUNTER — Other Ambulatory Visit: Payer: Self-pay | Admitting: *Deleted

## 2020-06-12 MED ORDER — ONETOUCH VERIO FLEX SYSTEM W/DEVICE KIT: PACK | 0 refills | Status: DC

## 2020-06-12 MED ORDER — ONETOUCH VERIO VI STRP: 1.0000 | ORAL_STRIP | 3 refills | Status: DC | PRN

## 2020-06-12 MED ORDER — ONETOUCH DELICA PLUS LANCET33G MISC: 3 refills | Status: DC

## 2020-06-12 NOTE — Telephone Encounter (Signed)
Rx sent for one touch verio

## 2020-07-07 ENCOUNTER — Other Ambulatory Visit: Payer: Self-pay | Admitting: Internal Medicine

## 2020-07-14 ENCOUNTER — Other Ambulatory Visit: Payer: Self-pay

## 2020-07-14 ENCOUNTER — Other Ambulatory Visit (INDEPENDENT_AMBULATORY_CARE_PROVIDER_SITE_OTHER): Payer: Medicare HMO

## 2020-07-14 DIAGNOSIS — E1165 Type 2 diabetes mellitus with hyperglycemia: Secondary | ICD-10-CM | POA: Diagnosis not present

## 2020-07-14 LAB — BASIC METABOLIC PANEL
BUN: 17 mg/dL (ref 6–23)
CO2: 25 mEq/L (ref 19–32)
Calcium: 9.7 mg/dL (ref 8.4–10.5)
Chloride: 103 mEq/L (ref 96–112)
Creatinine, Ser: 0.94 mg/dL (ref 0.40–1.50)
GFR: 81.72 mL/min (ref >60.00)
Glucose, Bld: 169 mg/dL — ABNORMAL HIGH (ref 70–99)
Potassium: 4.5 mEq/L (ref 3.5–5.1)
Sodium: 137 mEq/L (ref 135–145)

## 2020-07-15 LAB — FRUCTOSAMINE: Fructosamine: 316 umol/L — ABNORMAL HIGH (ref 0–285)

## 2020-07-17 ENCOUNTER — Ambulatory Visit: Payer: Medicare HMO | Admitting: Endocrinology

## 2020-07-17 ENCOUNTER — Other Ambulatory Visit: Payer: Self-pay

## 2020-07-17 ENCOUNTER — Encounter: Payer: Self-pay | Admitting: Endocrinology

## 2020-07-17 VITALS — BP 138/90 | HR 61 | Ht 71.0 in | Wt 205.2 lb

## 2020-07-17 DIAGNOSIS — E1165 Type 2 diabetes mellitus with hyperglycemia: Secondary | ICD-10-CM

## 2020-07-17 LAB — POCT GLYCOSYLATED HEMOGLOBIN (HGB A1C): Hemoglobin A1C: 7.2 % — AB (ref 4.0–5.6)

## 2020-07-17 MED ORDER — DAPAGLIFLOZIN PROPANEDIOL 10 MG PO TABS: 10.0000 mg | ORAL_TABLET | Freq: Every day | ORAL | 2 refills | Status: DC

## 2020-07-17 NOTE — Progress Notes (Addendum)
Patient ID: Steven Wheeler, male   DOB: [birth year], 70 y.o.   MRN: [id number]           Reason for Appointment: for Type 2 Diabetes  Referring PCP: [place]   History of Present Illness:          Date of diagnosis of type 2 diabetes mellitus:  2017      Background history:  He was asymptomatic at the time of diagnosis with A1c 6.9 He was started on metformin 500 mg twice daily which has been continued unchanged since then Has not been on any other medications  Recent history:   A1c is 7.2, previously was 8.5 on 04/15/2020   Non-insulin hypoglycemic drugs the patient is taking are: Metformin 500 mg twice daily, Farxiga 5 mg daily  Current management, blood sugar patterns and problems identified: He has been able to start checking his blood sugars on his own since his last visit about 6 weeks ago He says with trying to check his blood sugars he is able to understand what foods are making his blood sugar go up Highest blood sugar was 221 after dessert at a restaurant He has tried to cut back on carbohydrates like potatoes Currently not having a meal plan and is open to seeing the dietitian His weight is down 3 pounds with starting Farxiga No side effects with this Previously his fasting blood sugar has been averaging about 200 and now they are about 155 at home on average However he has a few readings over 200 postprandially Previously has not tried higher doses of metformin and has had no side effects from this        Side effects from medications have been: None     Typical meal intake: Breakfast is cereal or oatmeal, bacon/eggs.  Usually fast food at dinnertime.  Snacks will be fruit, trail mix and popcorn               Exercise:  Walking 3 miles 6 days a week  Glucose monitoring:  About once a day      Glucometer: Verio     Blood Glucose readings by time of day and averages from meter download:   PRE-MEAL Fasting Lunch Dinner Bedtime Overall  Glucose range:  123-187       Mean/median:  155     172   POST-MEAL PC Breakfast PC Lunch PC Dinner  Glucose range:   165-231  138-199  Mean/median:  188  195  166    Dietician visit, most recent: 2019  Weight history:  Wt Readings from Last 3 Encounters:  07/17/20 205 lb 3.2 oz (93.1 kg)  06/05/20 208 lb (94.3 kg)  04/17/20 209 lb (94.8 kg)    Glycemic control:   Lab Results  Component Value Date   HGBA1C 7.2 (A) 07/17/2020   HGBA1C 8.5 (H) 04/15/2020   HGBA1C 8.1 (H) 01/14/2020   Lab Results  Component Value Date   MICROALBUR 0.4 11/13/2019   LDLCALC 65 04/15/2020   CREATININE 0.94 07/14/2020   Lab Results  Component Value Date   MICRALBCREAT 2 11/13/2019    Lab Results  Component Value Date   FRUCTOSAMINE 316 (H) 07/14/2020    Office Visit on 07/17/2020  Component Date Value Ref Range Status   Hemoglobin A1C 07/17/2020 7.2* 4.0 - 5.6 % Final  Lab on 07/14/2020  Component Date Value Ref Range Status   Fructosamine 07/14/2020 316* 0 - 285 umol/L Final   Comment: Published reference  interval for apparently healthy subjects between age 27 and 75 is 51 - 285 umol/L and in a poorly controlled diabetic population is 228 - 563 umol/L with a mean of 396 umol/L.    Sodium 07/14/2020 137  135 - 145 mEq/L Final   Potassium 07/14/2020 4.5  3.5 - 5.1 mEq/L Final   Chloride 07/14/2020 103  96 - 112 mEq/L Final   CO2 07/14/2020 25  19 - 32 mEq/L Final   Glucose, Bld 07/14/2020 169* 70 - 99 mg/dL Final   BUN 07/14/2020 17  6 - 23 mg/dL Final   Creatinine, Ser 07/14/2020 0.94  0.40 - 1.50 mg/dL Final   GFR 07/14/2020 81.72  >60.00 mL/min Final   Calculated using the CKD-EPI Creatinine Equation (2021)   Calcium 07/14/2020 9.7  8.4 - 10.5 mg/dL Final    Allergies as of 07/17/2020   No Known Allergies      Medication List        Accurate as of Jul 17, 2020  2:05 PM. If you have any questions, ask your nurse or doctor.          aspirin 81 MG EC tablet Take 81 mg by mouth daily.    CINNAMON PO Take by mouth.   co-enzyme Q-10 50 MG capsule Take 50 mg by mouth daily.   dapagliflozin propanediol 5 MG Tabs tablet Commonly known as: Farxiga Take 1 tablet (5 mg total) by mouth daily.   FREESTYLE LITE test strip Generic drug: glucose blood USE AS INSTRUCTED TO CHECK BLOOD SUGAR ONCE A DAY   OneTouch Verio test strip Generic drug: glucose blood 1 each by Other route as needed for other. Use as instructed to check blood sugar once a day Dx code E11.9   KRILL OIL OMEGA-3 PO Take by mouth.   metFORMIN 500 MG tablet Commonly known as: GLUCOPHAGE TAKE ONE TABLET BY MOUTH TWICE A DAY WITH MEALS   multivitamin tablet Take 1 tablet by mouth daily.   NON FORMULARY Tumeric, berberine?   NON FORMULARY 1 capsule daily at 2 am. Cholestrol complete   OneTouch Delica Plus [id number] Misc Use to check blood sugar once a day Dx code E11.9   OneTouch Verio Flex System w/Device Kit Use to check blood sugar once daily  Dx code E11.9   VITAMIN D PO Take by mouth.        Allergies: No Known Allergies  History reviewed. No pertinent past medical history.  Past Surgical History:  Procedure Laterality Date   WISDOM TOOTH EXTRACTION      Family History  Problem Relation Age of Onset   Heart disease Father    Diabetes Father    Obesity Father    Cancer Sister     Social History:  reports that he has never smoked. He has quit using smokeless tobacco. He reports current alcohol use. He reports that he does not use drugs.   Review of Systems  Lipid history: Has not been on statins.  However he takes red rice yeast OTC     Lab Results  Component Value Date   CHOL 111 04/15/2020   HDL 29 (L) 04/15/2020   LDLCALC 65 04/15/2020   TRIG 90 04/15/2020   CHOLHDL 3.8 04/15/2020           No history of hypertension:  Tends to have whitecoat syndrome  BP Readings from Last 3 Encounters:  07/17/20 138/90  06/05/20 138/80  04/17/20 120/80    Most recent  eye exam  was in 2/22, normal  Most recent foot exam: 4/22  Currently known complications of diabetes: None  LABS:  Office Visit on 07/17/2020  Component Date Value Ref Range Status   Hemoglobin A1C 07/17/2020 7.2* 4.0 - 5.6 % Final  Lab on 07/14/2020  Component Date Value Ref Range Status   Fructosamine 07/14/2020 316* 0 - 285 umol/L Final   Comment: Published reference interval for apparently healthy subjects between age 37 and 72 is 4 - 285 umol/L and in a poorly controlled diabetic population is 228 - 563 umol/L with a mean of 396 umol/L.    Sodium 07/14/2020 137  135 - 145 mEq/L Final   Potassium 07/14/2020 4.5  3.5 - 5.1 mEq/L Final   Chloride 07/14/2020 103  96 - 112 mEq/L Final   CO2 07/14/2020 25  19 - 32 mEq/L Final   Glucose, Bld 07/14/2020 169* 70 - 99 mg/dL Final   BUN 07/14/2020 17  6 - 23 mg/dL Final   Creatinine, Ser 07/14/2020 0.94  0.40 - 1.50 mg/dL Final   GFR 07/14/2020 81.72  >60.00 mL/min Final   Calculated using the CKD-EPI Creatinine Equation (2021)   Calcium 07/14/2020 9.7  8.4 - 10.5 mg/dL Final    Physical Examination:  BP 138/90   Pulse 61   Ht _0  (1.803 m)   Wt 205 lb 3.2 oz (93.1 kg)   SpO2 97%   BMI 28.62 kg/m   Exam not indicated    ASSESSMENT:  Diabetes type 2, non-insulin-dependent  See history of present illness for detailed discussion of current diabetes management, blood sugar patterns and problems identified  A1c is 7.2 and fructosamine is 316 Previously A1c has been usually over 8%  Current treatment regimen is metformin 500 mg twice daily is also on recently started Farxiga 5 mg daily Blood sugars are still not optimal with morning readings averaging about 150 and postprandial readings at times higher also He is exercising regularly Likely can do better with diet and is open to more diabetes education especially nutritional planning   PLAN:    Glucose monitoring: Continue as above  Diabetes education: Send  referral to the nutritionist for meal planning  Lifestyle changes: Dietary changes: Likely needs low-fat and low carbohydrate diet which he is trying to improve  Exercise regimen: To be maintained  Therapy changes:  Farxiga 10 mg daily Increase metformin to 1500 mg and then 2000 mg if tolerated in divided doses If effective we can consider changing to Xigduo on the next visit  Follow-up: 6 weeks    Patient Instructions  Check blood sugars on waking up 2-3 days a week  Also check blood sugars about 2 hours after meals and do this after different meals by rotation  Recommended blood sugar levels on waking up are 90-130 and about 2 hours after meal is 130-180  Please bring your blood sugar monitor to each visit, thank you  Metformin 1 am and 2 pm for 7 days then 2 and 2  Faxiga 68m     AElayne Snare5/26/2022, 2:05 PM   Note: This office note was prepared with Dragon voice recognition system technology. Any transcriptional errors that result from this process are unintentional.

## 2020-07-17 NOTE — Patient Instructions (Signed)
Check blood sugars on waking up 2-3 days a week  Also check blood sugars about 2 hours after meals and do this after different meals by rotation  Recommended blood sugar levels on waking up are 90-130 and about 2 hours after meal is 130-180  Please bring your blood sugar monitor to each visit, thank you  Metformin 1 am and 2 pm for 7 days then 2 and 2  Faxiga 10mg 

## 2020-08-29 ENCOUNTER — Other Ambulatory Visit (INDEPENDENT_AMBULATORY_CARE_PROVIDER_SITE_OTHER): Payer: Medicare HMO

## 2020-08-29 ENCOUNTER — Other Ambulatory Visit: Payer: Self-pay

## 2020-08-29 DIAGNOSIS — E1165 Type 2 diabetes mellitus with hyperglycemia: Secondary | ICD-10-CM | POA: Diagnosis not present

## 2020-08-29 LAB — BASIC METABOLIC PANEL
BUN: 20 mg/dL (ref 6–23)
CO2: 25 mEq/L (ref 19–32)
Calcium: 9.7 mg/dL (ref 8.4–10.5)
Chloride: 103 mEq/L (ref 96–112)
Creatinine, Ser: 1.02 mg/dL (ref 0.40–1.50)
GFR: 74.03 mL/min (ref >60.00)
Glucose, Bld: 148 mg/dL — ABNORMAL HIGH (ref 70–99)
Potassium: 4.3 mEq/L (ref 3.5–5.1)
Sodium: 137 mEq/L (ref 135–145)

## 2020-08-29 LAB — HEMOGLOBIN A1C: Hgb A1c MFr Bld: 7.5 % — ABNORMAL HIGH (ref 4.6–6.5)

## 2020-09-04 ENCOUNTER — Ambulatory Visit (INDEPENDENT_AMBULATORY_CARE_PROVIDER_SITE_OTHER): Payer: Medicare HMO | Admitting: Endocrinology

## 2020-09-04 ENCOUNTER — Other Ambulatory Visit: Payer: Self-pay

## 2020-09-04 ENCOUNTER — Encounter: Payer: Self-pay | Admitting: Endocrinology

## 2020-09-04 VITALS — BP 142/82 | HR 65 | Ht 71.0 in | Wt 199.0 lb

## 2020-09-04 DIAGNOSIS — E1165 Type 2 diabetes mellitus with hyperglycemia: Secondary | ICD-10-CM | POA: Diagnosis not present

## 2020-09-04 MED ORDER — XIGDUO XR 5-1000 MG PO TB24
1.0000 | ORAL_TABLET | Freq: Every day | ORAL | 3 refills | Status: DC
Start: 2020-09-04 — End: 2021-03-13

## 2020-09-04 NOTE — Progress Notes (Signed)
Patient ID: Steven Wheeler, male   DOB: [birth year], 70 y.o.   MRN: [id number]           Reason for Appointment: for Type 2 Diabetes  Referring PCP: [facility]   History of Present Illness:          Date of diagnosis of type 2 diabetes mellitus:  2017      Background history:  He was asymptomatic at the time of diagnosis with A1c 6.9 He was started on metformin 500 mg twice daily which has been continued unchanged since then Has not been on any other medications  Recent history:   A1c is 7.5 compared to 7.2   Non-insulin hypoglycemic drugs the patient is taking are: Metformin 500 mg a.m. and 1 g p.m., Farxiga 10 mg daily  Current management, blood sugar patterns and problems identified: He has been taking an additional dose of metformin but did not increase his morning dose to 2 tablets as directed on the last visit in May He thinks he is taking the current dose of 10 mg Farxiga that was prescribed also Although his A1c is not improved his blood sugars appear to be relatively better He says he has been generally cutting back on carbohydrates and portions Usually eating a smaller meal in the evening and frequently salads He has lost another 6 pounds since his last visit Previously his fasting blood sugar has been averaging 155 200 and now they are averaging 146 but overall average is down to 147 compared to 172 No further readings over 200  no side effects with either higher dose of metformin or Farxiga        Side effects from medications have been: None     Typical meal intake: Breakfast is cereal or oatmeal, bacon/eggs.  Usually fast food at dinnertime.  Snacks will be fruit, trail mix and popcorn               Exercise:  Walking 3 miles 6 days a week  Glucose monitoring:  About once a day      Glucometer: Verio     Blood Glucose readings by time of day and averages from meter download:   PRE-MEAL Fasting Lunch Dinner Bedtime Overall  Glucose range: 123-159    109-194   Mean/median: 146   127 147   POST-MEAL PC Breakfast PC Lunch PC Dinner  Glucose range:     Mean/median: 151 160    Previously:  PRE-MEAL Fasting Lunch Dinner Bedtime Overall  Glucose range:  123-187      Mean/median:  155     172   POST-MEAL PC Breakfast PC Lunch PC Dinner  Glucose range:   165-231  138-199  Mean/median:  188  195  166    Dietician visit, most recent: 2019  Weight history:  Wt Readings from Last 3 Encounters:  09/04/20 199 lb (90.3 kg)  07/17/20 205 lb 3.2 oz (93.1 kg)  06/05/20 208 lb (94.3 kg)    Glycemic control:   Lab Results  Component Value Date   HGBA1C 7.5 (H) 08/29/2020   HGBA1C 7.2 (A) 07/17/2020   HGBA1C 8.5 (H) 04/15/2020   Lab Results  Component Value Date   MICROALBUR 0.4 11/13/2019   LDLCALC 65 04/15/2020   CREATININE 1.02 08/29/2020   Lab Results  Component Value Date   MICRALBCREAT 2 11/13/2019    Lab Results  Component Value Date   FRUCTOSAMINE 316 (H) 07/14/2020    Lab on 08/29/2020  Component  Date Value Ref Range Status   Sodium 08/29/2020 137  135 - 145 mEq/L Final   Potassium 08/29/2020 4.3  3.5 - 5.1 mEq/L Final   Chloride 08/29/2020 103  96 - 112 mEq/L Final   CO2 08/29/2020 25  19 - 32 mEq/L Final   Glucose, Bld 08/29/2020 148 (A) 70 - 99 mg/dL Final   BUN 08/29/2020 20  6 - 23 mg/dL Final   Creatinine, Ser 08/29/2020 1.02  0.40 - 1.50 mg/dL Final   GFR 08/29/2020 74.03  >60.00 mL/min Final   Calculated using the CKD-EPI Creatinine Equation (2021)   Calcium 08/29/2020 9.7  8.4 - 10.5 mg/dL Final   Hgb A1c MFr Bld 08/29/2020 7.5 (A) 4.6 - 6.5 % Final   Glycemic Control Guidelines for People with Diabetes:Non Diabetic:  <6%Goal of Therapy: <7%Additional Action Suggested:  >8%     Allergies as of 09/04/2020   No Known Allergies      Medication List        Accurate as of September 04, 2020 10:04 AM. If you have any questions, ask your nurse or doctor.          aspirin 81 MG EC tablet Take 81 mg by  mouth daily.   CINNAMON PO Take by mouth.   co-enzyme Q-10 50 MG capsule Take 50 mg by mouth daily.   dapagliflozin propanediol 10 MG Tabs tablet Commonly known as: Farxiga Take 1 tablet (10 mg total) by mouth daily before breakfast.   FREESTYLE LITE test strip Generic drug: glucose blood USE AS INSTRUCTED TO CHECK BLOOD SUGAR ONCE A DAY   OneTouch Verio test strip Generic drug: glucose blood 1 each by Other route as needed for other. Use as instructed to check blood sugar once a day Dx code E11.9   KRILL OIL OMEGA-3 PO Take by mouth.   metFORMIN 500 MG tablet Commonly known as: GLUCOPHAGE TAKE ONE TABLET BY MOUTH TWICE A DAY WITH MEALS   multivitamin tablet Take 1 tablet by mouth daily.   NON FORMULARY Tumeric, berberine?   NON FORMULARY 1 capsule daily at 2 am. Cholestrol complete   OneTouch Delica Plus [id number] Misc Use to check blood sugar once a day Dx code E11.9   OneTouch Verio Flex System w/Device Kit Use to check blood sugar once daily  Dx code E11.9   VITAMIN D PO Take by mouth.        Allergies: No Known Allergies  No past medical history on file.  Past Surgical History:  Procedure Laterality Date   WISDOM TOOTH EXTRACTION      Family History  Problem Relation Age of Onset   Heart disease Father    Diabetes Father    Obesity Father    Cancer Sister     Social History:  reports that he has never smoked. He has quit using smokeless tobacco. He reports current alcohol use. He reports that he does not use drugs.   Review of Systems  Lipid history: Has not been on statins.  However he takes red rice yeast OTC     Lab Results  Component Value Date   CHOL 111 04/15/2020   HDL 29 (L) 04/15/2020   LDLCALC 65 04/15/2020   TRIG 90 04/15/2020   CHOLHDL 3.8 04/15/2020           No history of hypertension:  Tends to have whitecoat syndrome  BP Readings from Last 3 Encounters:  09/04/20 (!) 142/82  07/17/20 138/90  06/05/20 138/80  Most recent eye exam was in 2/22, normal  Most recent foot exam: 4/22  Currently known complications of diabetes: None  LABS:  Lab on 08/29/2020  Component Date Value Ref Range Status   Sodium 08/29/2020 137  135 - 145 mEq/L Final   Potassium 08/29/2020 4.3  3.5 - 5.1 mEq/L Final   Chloride 08/29/2020 103  96 - 112 mEq/L Final   CO2 08/29/2020 25  19 - 32 mEq/L Final   Glucose, Bld 08/29/2020 148 (A) 70 - 99 mg/dL Final   BUN 08/29/2020 20  6 - 23 mg/dL Final   Creatinine, Ser 08/29/2020 1.02  0.40 - 1.50 mg/dL Final   GFR 08/29/2020 74.03  >60.00 mL/min Final   Calculated using the CKD-EPI Creatinine Equation (2021)   Calcium 08/29/2020 9.7  8.4 - 10.5 mg/dL Final   Hgb A1c MFr Bld 08/29/2020 7.5 (A) 4.6 - 6.5 % Final   Glycemic Control Guidelines for People with Diabetes:Non Diabetic:  <6%Goal of Therapy: <7%Additional Action Suggested:  >8%     Physical Examination:  BP (!) 142/82   Pulse 65   Ht 5' 11"  (1.803 m)   Wt 199 lb (90.3 kg)   SpO2 96%   BMI 27.75 kg/m       ASSESSMENT:  Diabetes type 2, non-insulin-dependent  See history of present illness for detailed discussion of current diabetes management, blood sugar patterns and problems identified  A1c is 7.5  Current treatment regimen is metformin 1500 mg daily and Farxiga 10 mg daily His blood sugars overall are better although not clear why his A1c is not improved  He has done well with lifestyle changes and weight loss Only has mild increase in fasting blood sugars but overall averaging 147 at home, some monitoring after meals also  He can likely benefit from increasing his metformin another 500 mg   PLAN:    Start taking Xigduo 06/998 2 tablets daily He will call if he sees any consistently higher readings  Follow-up: 3 months    There are no Patient Instructions on file for this visit.     Elayne Snare 09/04/2020, 10:04 AM   Note: This office note was prepared with Dragon voice  recognition system technology. Any transcriptional errors that result from this process are unintentional.

## 2020-09-04 NOTE — Patient Instructions (Signed)
Check blood sugars on waking up 3 days a week  Also check blood sugars about 2 hours after meals and do this after different meals by rotation  Recommended blood sugar levels on waking up are 90-130 and about 2 hours after meal is 130-160  Please bring your blood sugar monitor to each visit, thank you   

## 2020-09-16 ENCOUNTER — Encounter: Payer: Self-pay | Admitting: Internal Medicine

## 2020-09-18 ENCOUNTER — Ambulatory Visit: Payer: Medicare HMO | Admitting: Dietician

## 2020-11-11 ENCOUNTER — Other Ambulatory Visit: Payer: Medicare HMO | Admitting: Internal Medicine

## 2020-11-13 ENCOUNTER — Encounter: Payer: Medicare HMO | Admitting: Internal Medicine

## 2020-11-17 ENCOUNTER — Encounter: Payer: Self-pay | Admitting: Internal Medicine

## 2020-11-17 ENCOUNTER — Ambulatory Visit (AMBULATORY_SURGERY_CENTER): Payer: Self-pay | Admitting: *Deleted

## 2020-11-17 ENCOUNTER — Other Ambulatory Visit: Payer: Self-pay

## 2020-11-17 VITALS — Ht 71.0 in | Wt 193.0 lb

## 2020-11-17 DIAGNOSIS — Z1211 Encounter for screening for malignant neoplasm of colon: Secondary | ICD-10-CM

## 2020-11-17 NOTE — Progress Notes (Signed)
No egg or soy allergy known to patient  No issues known to pt with past sedation with any surgeries or procedures Patient denies ever being told they had issues or difficulty with intubation  No FH of Malignant Hyperthermia Pt is not on diet pills Pt is not on  home 02  Pt is not on blood thinners  Pt denies issues with constipation  No A fib or A flutter  EMMI video to pt or via [place] 19 guidelines implemented in PV today with Pt and RN   Pt is fully vaccinated  for Covid   Due to the COVID-19 pandemic we are asking patients to follow certain guidelines.  Pt aware of COVID protocols and [facility] guidelines

## 2020-11-22 DIAGNOSIS — Z860101 Personal history of adenomatous and serrated colon polyps: Secondary | ICD-10-CM

## 2020-11-22 DIAGNOSIS — Z86010 Personal history of colonic polyps: Secondary | ICD-10-CM

## 2020-11-22 HISTORY — DX: Personal history of adenomatous and serrated colon polyps: Z86.0101

## 2020-11-22 HISTORY — DX: Personal history of colonic polyps: Z86.010

## 2020-11-26 ENCOUNTER — Ambulatory Visit (AMBULATORY_SURGERY_CENTER): Payer: Medicare HMO | Admitting: Internal Medicine

## 2020-11-26 ENCOUNTER — Encounter: Payer: Self-pay | Admitting: Internal Medicine

## 2020-11-26 ENCOUNTER — Other Ambulatory Visit: Payer: Self-pay

## 2020-11-26 VITALS — BP 116/55 | HR 56 | Temp 98.4°F | Resp 6 | Ht 71.0 in | Wt 193.0 lb

## 2020-11-26 DIAGNOSIS — Z1211 Encounter for screening for malignant neoplasm of colon: Secondary | ICD-10-CM | POA: Diagnosis not present

## 2020-11-26 DIAGNOSIS — D123 Benign neoplasm of transverse colon: Secondary | ICD-10-CM

## 2020-11-26 DIAGNOSIS — E119 Type 2 diabetes mellitus without complications: Secondary | ICD-10-CM | POA: Diagnosis not present

## 2020-11-26 DIAGNOSIS — E669 Obesity, unspecified: Secondary | ICD-10-CM | POA: Diagnosis not present

## 2020-11-26 MED ORDER — SODIUM CHLORIDE 0.9 % IV SOLN: 500.0000 mL | Freq: Once | INTRAVENOUS | Status: DC

## 2020-11-26 NOTE — Patient Instructions (Addendum)
I found and removed one small polyp. You also have a condition called diverticulosis - common and not usually a problem. Please read the handout provided. Hemorrhoids were swollen also, this is often seen after colonoscopy prep diarrhea.  I will let you know pathology results and when/if to have another routine colonoscopy by mail and/or My Chart. With only one small polyp at your age and the usual timing of repeat exams, you probably will not need another routine colonoiscopy.  I appreciate the opportunity to care for you. Gatha Mayer, MD, [facility]  Handout given re: polyps, diverticulosis Resume previous diet Continue current medications Await pathology results  YOU HAD AN ENDOSCOPIC PROCEDURE TODAY AT [place]:   Refer to the procedure report that was given to you for any specific questions about what was found during the examination.  If the procedure report does not answer your questions, please call your gastroenterologist to clarify.  If you requested that your care partner not be given the details of your procedure findings, then the procedure report has been included in a sealed envelope for you to review at your convenience later.  YOU SHOULD EXPECT: Some feelings of bloating in the abdomen. Passage of more gas than usual.  Walking can help get rid of the air that was put into your GI tract during the procedure and reduce the bloating. If you had a lower endoscopy (such as a colonoscopy or flexible sigmoidoscopy) you may notice spotting of blood in your stool or on the toilet paper. If you underwent a bowel prep for your procedure, you may not have a normal bowel movement for a few days.  Please Note:  You might notice some irritation and congestion in your nose or some drainage.  This is from the oxygen used during your procedure.  There is no need for concern and it should clear up in a day or so.  SYMPTOMS TO REPORT IMMEDIATELY:  Following lower endoscopy  (colonoscopy or flexible sigmoidoscopy):  Excessive amounts of blood in the stool  Significant tenderness or worsening of abdominal pains  Swelling of the abdomen that is new, acute  Fever of 100F or higher  For urgent or emergent issues, a gastroenterologist can be reached at any hour by calling [phone]. Do not use MyChart messaging for urgent concerns.   DIET:  We do recommend a small meal at first, but then you may proceed to your regular diet.  Drink plenty of fluids but you should avoid alcoholic beverages for 24 hours.  ACTIVITY:  You should plan to take it easy for the rest of today and you should NOT DRIVE or use heavy machinery until tomorrow (because of the sedation medicines used during the test).    FOLLOW UP: Our staff will call the number listed on your records 48-72 hours following your procedure to check on you and address any questions or concerns that you may have regarding the information given to you following your procedure. If we do not reach you, we will leave a message.  We will attempt to reach you two times.  During this call, we will ask if you have developed any symptoms of COVID 19. If you develop any symptoms (ie: fever, flu-like symptoms, shortness of breath, cough etc.) before then, please call [phone].  If you test positive for Covid 19 in the 2 weeks post procedure, please call and report this information to [place].    If any biopsies were taken you will  be contacted by phone or by letter within the next 1-3 weeks.  Please call us at [phone] if you have not heard about the biopsies in 3 weeks.   SIGNATURES/CONFIDENTIALITY: You and/or your care partner have signed paperwork which will be entered into your electronic medical record.  These signatures attest to the fact that that the information above on your After Visit Summary has been reviewed and is understood.  Full responsibility of the confidentiality of this discharge information lies with  you and/or your care-partner.

## 2020-11-26 NOTE — Op Note (Signed)
[facility] Patient Name: Steven Wheeler Procedure Date: 11/26/2020 9:04 AM MRN: [id number] Endoscopist: Gatha Mayer , MD Age: 70 Referring MD:  Date of Birth: Sep 24, 1950 Gender: Male Account #: [id number] Procedure:                Colonoscopy Indications:              Screening for colorectal malignant neoplasm Medicines:                Propofol per Anesthesia, Monitored Anesthesia Care Procedure:                Pre-Anesthesia Assessment:                           - Prior to the procedure, a History and Physical                            was performed, and patient medications and                            allergies were reviewed. The patient's tolerance of                            previous anesthesia was also reviewed. The risks                            and benefits of the procedure and the sedation                            options and risks were discussed with the patient.                            All questions were answered, and informed consent                            was obtained. Prior Anticoagulants: The patient has                            taken no previous anticoagulant or antiplatelet                            agents. ASA Grade Assessment: II - A patient with                            mild systemic disease. After reviewing the risks                            and benefits, the patient was deemed in                            satisfactory condition to undergo the procedure.                           After obtaining informed consent, the colonoscope  was passed under direct vision. Throughout the                            procedure, the patient's blood pressure, pulse, and                            oxygen saturations were monitored continuously. The                            Olympus CF-[id number] (Serial# [id number]) Colonoscope was                            introduced through the anus and advanced to the the                             cecum, identified by appendiceal orifice and                            ileocecal valve. The colonoscopy was performed                            without difficulty. The patient tolerated the                            procedure well. The quality of the bowel                            preparation was good. The ileocecal valve,                            appendiceal orifice, and rectum were photographed. Scope In: 9:24:13 AM Scope Out: 9:41:32 AM Scope Withdrawal Time: 0 hours 13 minutes 37 seconds  Total Procedure Duration: 0 hours 17 minutes 19 seconds  Findings:                 The perianal and digital rectal examinations were                            normal. Pertinent negatives include normal prostate                            (size, shape, and consistency).                           A 6 mm polyp was found in the transverse colon. The                            polyp was flat. The polyp was removed with a                            piecemeal technique using a cold snare. Resection                            and retrieval were complete. Verification of  patient identification for the specimen was done.                            Estimated blood loss was minimal.                           Multiple diverticula were found in the sigmoid                            colon.                           External and internal hemorrhoids were found.                           The exam was otherwise without abnormality on                            direct and retroflexion views. Complications:            No immediate complications. Estimated Blood Loss:     Estimated blood loss was minimal. Impression:               - One 6 mm polyp in the transverse colon, removed                            piecemeal using a cold snare. Resected and                            retrieved.                           - Diverticulosis in the sigmoid colon.                            - External and internal hemorrhoids.                           - The examination was otherwise normal on direct                            and retroflexion views. Recommendation:           - Patient has a contact number available for                            emergencies. The signs and symptoms of potential                            delayed complications were discussed with the                            patient. Return to normal activities tomorrow.                            Written discharge instructions were provided to the  patient.                           - Resume previous diet.                           - Continue present medications.                           - Await pathology results.                           - No recommendation at this time regarding repeat                            colonoscopy due to age. Gatha Mayer, MD 11/26/2020 9:48:54 AM This report has been signed electronically.

## 2020-11-26 NOTE — Progress Notes (Signed)
Report to PACU, RN, vss, BBS= Clear.  

## 2020-11-26 NOTE — Progress Notes (Signed)
VS completed by [place].   Pt's states no medical or surgical changes since previsit or office visit.  

## 2020-11-26 NOTE — Progress Notes (Signed)
Called to room to assist during endoscopic procedure.  Patient ID and intended procedure confirmed with present staff. Received instructions for my participation in the procedure from the performing physician.  

## 2020-11-26 NOTE — Progress Notes (Signed)
[facility] History and Physical   Primary Care Physician:  Elby Showers, MD   Reason for Procedure:   Colon cancer screening  Plan:    colonoscopy     HPI: Steven Wheeler is a 70 y.o. male here for screening colonoscopy.   Past Medical History:  Diagnosis Date   Diabetes mellitus without complication [facility])    Diverticulosis    2009 Virtual colonoscopy    Past Surgical History:  Procedure Laterality Date   virtual colonoscopy  2009   tics   WISDOM TOOTH EXTRACTION      Prior to Admission medications   Medication Sig Start Date End Date Taking? Authorizing Provider  ARGININE PO Take by mouth.   Yes [provider]  Barberry-Oreg Grape-Goldenseal (BERBERINE COMPLEX PO) Take by mouth.   Yes [provider]  Blood Glucose Monitoring Suppl ([place]) w/Device KIT Use to check blood sugar once daily  Dx code E11.9 06/12/20  Yes Elayne Snare, MD  Cholecalciferol (VITAMIN D PO) Take by mouth.   Yes [provider]  CINNAMON PO Take by mouth.   Yes [provider]  co-enzyme Q-10 50 MG capsule Take 50 mg by mouth daily.   Yes [provider]  Dapagliflozin-metFORMIN HCl ER (XIGDUO XR) 06-998 MG TB24 Take 1 tablet by mouth daily. 09/04/20  Yes Elayne Snare, MD  FREESTYLE LITE test strip USE AS INSTRUCTED TO CHECK BLOOD SUGAR ONCE A DAY 06/12/20  Yes Elayne Snare, MD  glucose blood (ONETOUCH VERIO) test strip 1 each by Other route as needed for other. Use as instructed to check blood sugar once a day Dx code E11.9 06/12/20  Yes Elayne Snare, MD  KRILL OIL OMEGA-3 PO Take by mouth.   Yes [provider]  Lancets [facility] DELICA PLUS [id number]) MISC Use to check blood sugar once a day Dx code E11.9 06/12/20  Yes Elayne Snare, MD  Multiple Vitamin (MULTIVITAMIN) tablet Take 1 tablet by mouth daily.   Yes [provider]  NON FORMULARY Tumeric, berberine?   Yes [provider]  NON  FORMULARY 1 capsule daily at 2 am. Cholestrol complete   Yes [provider]  OVER THE COUNTER MEDICATION Apigenin 50 mg daily   Yes [provider]    Current Outpatient Medications  Medication Sig Dispense Refill   ARGININE PO Take by mouth.     Barberry-Oreg Grape-Goldenseal (BERBERINE COMPLEX PO) Take by mouth.     Blood Glucose Monitoring Suppl ([place]) w/Device KIT Use to check blood sugar once daily  Dx code E11.9 1 kit 0   Cholecalciferol (VITAMIN D PO) Take by mouth.     CINNAMON PO Take by mouth.     co-enzyme Q-10 50 MG capsule Take 50 mg by mouth daily.     Dapagliflozin-metFORMIN HCl ER (XIGDUO XR) 06-998 MG TB24 Take 1 tablet by mouth daily. 60 tablet 3   FREESTYLE LITE test strip USE AS INSTRUCTED TO CHECK BLOOD SUGAR ONCE A DAY 100 strip 1   glucose blood (ONETOUCH VERIO) test strip 1 each by Other route as needed for other. Use as instructed to check blood sugar once a day Dx code E11.9 50 each 3   KRILL OIL OMEGA-3 PO Take by mouth.     Lancets (ONETOUCH DELICA PLUS [id number]) MISC Use to check blood sugar once a day Dx code E11.9 50 each 3   Multiple Vitamin (MULTIVITAMIN) tablet Take 1 tablet by mouth  daily.     NON FORMULARY Tumeric, berberine?     NON FORMULARY 1 capsule daily at 2 am. Cholestrol complete     OVER THE COUNTER MEDICATION Apigenin 50 mg daily     Current Facility-Administered Medications  Medication Dose Route Frequency Provider Last Rate Last Admin   0.9 %  sodium chloride infusion  500 mL Intravenous Once Gatha Mayer, MD        Allergies as of 11/26/2020   (No Known Allergies)    Family History  Problem Relation Age of Onset   Heart disease Father    Diabetes Father    Obesity Father    Breast cancer Sister    Cancer Sister    Kidney Stones Brother    Colon cancer Neg Hx    Colon polyps Neg Hx    Esophageal cancer Neg Hx    Stomach cancer Neg Hx    Rectal cancer Neg Hx     Social History    Socioeconomic History   Marital status: Married                        Tobacco Use   Smoking status: Never   Smokeless tobacco: Never  Vaping Use   Vaping Use: Never used  Substance and Sexual Activity   Alcohol use: Yes    Comment: rare once a month maybe   Drug use: No     Review of Systems: All other review of systems negative except as mentioned in the HPI.  Physical Exam: Vital signs BP (!) 144/70   Pulse (!) 59   Temp 98.4 F (36.9 C) (Temporal)   Resp 12   Ht _0  (1.803 m)   Wt 193 lb (87.5 kg)   SpO2 98%   BMI 26.92 kg/m   General:   Alert,  Well-developed, well-nourished, pleasant and cooperative in NAD Lungs:  Clear throughout to auscultation.   Heart:  Regular rate and rhythm; no murmurs, clicks, rubs,  or gallops. Abdomen:  Soft, nontender and nondistended. Normal bowel sounds.   Neuro/Psych:  Alert and cooperative. Normal mood and affect. A and O x 3   _1  E. Carlean Purl, MD, [facility] [phone] (pager) 11/26/2020 9:15 AM@

## 2020-11-28 ENCOUNTER — Telehealth: Payer: Self-pay

## 2020-11-28 ENCOUNTER — Telehealth: Payer: Self-pay | Admitting: *Deleted

## 2020-11-28 DIAGNOSIS — Z7984 Long term (current) use of oral hypoglycemic drugs: Secondary | ICD-10-CM | POA: Diagnosis not present

## 2020-11-28 DIAGNOSIS — Z833 Family history of diabetes mellitus: Secondary | ICD-10-CM | POA: Diagnosis not present

## 2020-11-28 DIAGNOSIS — Z803 Family history of malignant neoplasm of breast: Secondary | ICD-10-CM | POA: Diagnosis not present

## 2020-11-28 DIAGNOSIS — E1165 Type 2 diabetes mellitus with hyperglycemia: Secondary | ICD-10-CM | POA: Diagnosis not present

## 2020-11-28 DIAGNOSIS — R030 Elevated blood-pressure reading, without diagnosis of hypertension: Secondary | ICD-10-CM | POA: Diagnosis not present

## 2020-11-28 DIAGNOSIS — I951 Orthostatic hypotension: Secondary | ICD-10-CM | POA: Diagnosis not present

## 2020-11-28 NOTE — Telephone Encounter (Signed)
  Follow up Call-  Call back number 11/26/2020  Post procedure Call Back phone  # [phone]  Permission to leave phone message Yes  Some recent data might be hidden    First attempt for follow up phone call. No answer at number given.  Left message on voicemail.

## 2020-11-28 NOTE — Telephone Encounter (Signed)
  Follow up Call-  Call back number 11/26/2020  Post procedure Call Back phone  # [phone]  Permission to leave phone message Yes  Some recent data might be hidden     Patient questions:  Do you have a fever, pain , or abdominal swelling? No. Pain Score  0 *  Have you tolerated food without any problems? Yes.    Have you been able to return to your normal activities? Yes.    Do you have any questions about your discharge instructions: Diet   No. Medications  No. Follow up visit  No.  Do you have questions or concerns about your Care? No.  Actions: * If pain score is 4 or above: No action needed, pain <4.  Have you developed a fever since your procedure? no  2.   Have you had an respiratory symptoms (SOB or cough) since your procedure? no  3.   Have you tested positive for COVID 19 since your procedure no  4.   Have you had any family members/close contacts diagnosed with the COVID 19 since your procedure?  no   If yes to any of these questions please route to Joylene John, RN and Joella Prince, RN

## 2020-12-01 ENCOUNTER — Encounter: Payer: Self-pay | Admitting: Internal Medicine

## 2020-12-03 ENCOUNTER — Other Ambulatory Visit (INDEPENDENT_AMBULATORY_CARE_PROVIDER_SITE_OTHER): Payer: Medicare HMO

## 2020-12-03 ENCOUNTER — Other Ambulatory Visit: Payer: Self-pay

## 2020-12-03 DIAGNOSIS — E1165 Type 2 diabetes mellitus with hyperglycemia: Secondary | ICD-10-CM | POA: Diagnosis not present

## 2020-12-03 LAB — BASIC METABOLIC PANEL
BUN: 19 mg/dL (ref 6–23)
CO2: 28 mEq/L (ref 19–32)
Calcium: 10.1 mg/dL (ref 8.4–10.5)
Chloride: 103 mEq/L (ref 96–112)
Creatinine, Ser: 0.98 mg/dL (ref 0.40–1.50)
GFR: 77.52 mL/min (ref >60.00)
Glucose, Bld: 132 mg/dL — ABNORMAL HIGH (ref 70–99)
Potassium: 4.4 mEq/L (ref 3.5–5.1)
Sodium: 138 mEq/L (ref 135–145)

## 2020-12-03 LAB — HEMOGLOBIN A1C: Hgb A1c MFr Bld: 6.9 % — ABNORMAL HIGH (ref 4.6–6.5)

## 2020-12-05 ENCOUNTER — Other Ambulatory Visit: Payer: Self-pay

## 2020-12-05 ENCOUNTER — Ambulatory Visit (INDEPENDENT_AMBULATORY_CARE_PROVIDER_SITE_OTHER): Payer: Medicare HMO | Admitting: Endocrinology

## 2020-12-05 ENCOUNTER — Encounter: Payer: Self-pay | Admitting: Endocrinology

## 2020-12-05 VITALS — BP 122/78 | HR 66 | Ht 71.5 in | Wt 186.0 lb

## 2020-12-05 DIAGNOSIS — E1165 Type 2 diabetes mellitus with hyperglycemia: Secondary | ICD-10-CM | POA: Diagnosis not present

## 2020-12-05 NOTE — Progress Notes (Signed)
Patient ID: Steven Wheeler, male   DOB: October 29, 1950, 70 y.o.   MRN: [id number]           Reason for Appointment: Follow-up for Type 2 Diabetes  Referring PCP: [facility]   History of Present Illness:          Date of diagnosis of type 2 diabetes mellitus:  2017      Background history:  He was asymptomatic at the time of diagnosis with A1c 6.9 He was started on metformin 500 mg twice daily which has been continued unchanged since then Has not been on any other medications  Recent history:   A1c is 7.5 compared to 7.2   Non-insulin hypoglycemic drugs the patient is taking are: Xigduo 06/998, 1 tablet daily  Current management, blood sugar patterns and problems identified: He has been taking a combination of Farxiga and metformin now for convenience  However even though it was intended that he take 2 tablets of the Xigduo daily his prescription was written for only 1 tablet daily  Surprisingly however his blood sugars are overall improved  He tends to have relatively high fasting readings and these are slightly better but still averaging more than 130  He however tends to have fairly good blood sugars after meals  Has only 1 unusually high reading of 204 which he thinks was right after eating  He is eating a larger meal at lunch and checking mostly in the late afternoon  Lab glucose was 132 fasting He is still trying to walk very regularly in the mornings Overall trying to cut back on calories and carbohydrates but he thinks that sometimes some snacks at night may raise his morning sugars He is still tending to eat oatmeal in the mornings He has been referred to the dietitian but he wants to do this on his own        Side effects from medications have been: None     Typical meal intake: Breakfast is cereal or oatmeal, bacon/eggs.  Usually fast food at dinnertime.  Snacks will be fruit, trail mix and popcorn               Exercise:  Walking 3 miles 6 days a week  Glucose  monitoring:  About once a day      Glucometer: Verio     Blood Glucose readings by time of day and averages from meter download:   PRE-MEAL Fasting Lunch Dinner Bedtime Overall  Glucose range:     106-204  Mean/median: 135 136   140   POST-MEAL PC Breakfast PC Lunch PC Dinner  Glucose range:     Mean/median:  148    Previously:  PRE-MEAL Fasting Lunch Dinner Bedtime Overall  Glucose range: 123-159    109-194  Mean/median: 146   127 147   POST-MEAL PC Breakfast PC Lunch PC Dinner  Glucose range:     Mean/median: 151 160      Dietician visit, most recent: 2019  Weight history:  Wt Readings from Last 3 Encounters:  12/05/20 186 lb (84.4 kg)  11/26/20 193 lb (87.5 kg)  11/17/20 193 lb (87.5 kg)    Glycemic control:   Lab Results  Component Value Date   HGBA1C 6.9 (H) 12/03/2020   HGBA1C 7.5 (H) 08/29/2020   HGBA1C 7.2 (A) 07/17/2020   Lab Results  Component Value Date   MICROALBUR 0.4 11/13/2019   LDLCALC 65 04/15/2020   CREATININE 0.98 12/03/2020   Lab Results  Component Value  Date   MICRALBCREAT 2 11/13/2019    Lab Results  Component Value Date   FRUCTOSAMINE 316 (H) 07/14/2020    Lab on 12/03/2020  Component Date Value Ref Range Status   Sodium 12/03/2020 138  135 - 145 mEq/L Final   Potassium 12/03/2020 4.4  3.5 - 5.1 mEq/L Final   Chloride 12/03/2020 103  96 - 112 mEq/L Final   CO2 12/03/2020 28  19 - 32 mEq/L Final   Glucose, Bld 12/03/2020 132 (A) 70 - 99 mg/dL Final   BUN 12/03/2020 19  6 - 23 mg/dL Final   Creatinine, Ser 12/03/2020 0.98  0.40 - 1.50 mg/dL Final   GFR 12/03/2020 77.52  >60.00 mL/min Final   Calculated using the CKD-EPI Creatinine Equation (2021)   Calcium 12/03/2020 10.1  8.4 - 10.5 mg/dL Final   Hgb A1c MFr Bld 12/03/2020 6.9 (A) 4.6 - 6.5 % Final   Glycemic Control Guidelines for People with Diabetes:Non Diabetic:  <6%Goal of Therapy: <7%Additional Action Suggested:  >8%     Allergies as of 12/05/2020   No Known  Allergies      Medication List        Accurate as of December 05, 2020 10:28 AM. If you have any questions, ask your nurse or doctor.          ARGININE PO Take by mouth.   BERBERINE COMPLEX PO Take by mouth.   CINNAMON PO Take by mouth.   co-enzyme Q-10 50 MG capsule Take 50 mg by mouth daily.   FREESTYLE LITE test strip Generic drug: glucose blood USE AS INSTRUCTED TO CHECK BLOOD SUGAR ONCE A DAY   OneTouch Verio test strip Generic drug: glucose blood 1 each by Other route as needed for other. Use as instructed to check blood sugar once a day Dx code E11.9   KRILL OIL OMEGA-3 PO Take by mouth.   multivitamin tablet Take 1 tablet by mouth daily.   NON FORMULARY Tumeric, berberine?   NON FORMULARY 1 capsule daily at 2 am. Cholestrol complete   OneTouch Delica Plus [id number] Misc Use to check blood sugar once a day Dx code E11.9   OneTouch Verio Flex System w/Device Kit Use to check blood sugar once daily  Dx code E11.9   OVER THE COUNTER MEDICATION Apigenin 50 mg daily   VITAMIN D PO Take by mouth.   Xigduo XR 06-998 MG Tb24 Generic drug: Dapagliflozin-metFORMIN HCl ER Take 1 tablet by mouth daily.        Allergies: No Known Allergies  Past Medical History:  Diagnosis Date   Diabetes mellitus without complication ([place])    Diverticulosis    2009 Virtual colonoscopy   Hx of adenomatous polyp of colon 11/2020   6 mm - no routine repeat colonoscopy needed    Past Surgical History:  Procedure Laterality Date   virtual colonoscopy  2009   tics   WISDOM TOOTH EXTRACTION      Family History  Problem Relation Age of Onset   Heart disease Father    Diabetes Father    Obesity Father    Breast cancer Sister    Cancer Sister    Kidney Stones Brother    Colon cancer Neg Hx    Colon polyps Neg Hx    Esophageal cancer Neg Hx    Stomach cancer Neg Hx    Rectal cancer Neg Hx     Social History:  reports that he has never smoked. He has  never  used smokeless tobacco. He reports current alcohol use. He reports that he does not use drugs.   Review of Systems  Lipid history: Has not been on statins.  However he takes red rice yeast OTC     Lab Results  Component Value Date   CHOL 111 04/15/2020   HDL 29 (L) 04/15/2020   LDLCALC 65 04/15/2020   TRIG 90 04/15/2020   CHOLHDL 3.8 04/15/2020           No history of hypertension:  BP Readings from Last 3 Encounters:  12/05/20 122/78  11/26/20 (!) 116/55  09/04/20 (!) 142/82    Most recent eye exam was in 2/22, normal  Most recent foot exam: 4/22  Currently known complications of diabetes: None  LABS:  Lab on 12/03/2020  Component Date Value Ref Range Status   Sodium 12/03/2020 138  135 - 145 mEq/L Final   Potassium 12/03/2020 4.4  3.5 - 5.1 mEq/L Final   Chloride 12/03/2020 103  96 - 112 mEq/L Final   CO2 12/03/2020 28  19 - 32 mEq/L Final   Glucose, Bld 12/03/2020 132 (A) 70 - 99 mg/dL Final   BUN 12/03/2020 19  6 - 23 mg/dL Final   Creatinine, Ser 12/03/2020 0.98  0.40 - 1.50 mg/dL Final   GFR 12/03/2020 77.52  >60.00 mL/min Final   Calculated using the CKD-EPI Creatinine Equation (2021)   Calcium 12/03/2020 10.1  8.4 - 10.5 mg/dL Final   Hgb A1c MFr Bld 12/03/2020 6.9 (A) 4.6 - 6.5 % Final   Glycemic Control Guidelines for People with Diabetes:Non Diabetic:  <6%Goal of Therapy: <7%Additional Action Suggested:  >8%     Physical Examination:  BP 122/78 (BP Location: Left Arm, Patient Position: Sitting, Cuff Size: Small)   Pulse 66   Ht 5' 11.5" (1.816 m)   Wt 186 lb (84.4 kg)   BMI 25.58 kg/m       ASSESSMENT:  Diabetes type 2, non-insulin-dependent  See history of present illness for detailed discussion of current diabetes management, blood sugar patterns and problems identified  A1c is 6.9 compared to 7.5  Current treatment regimen is Xigduo 06/998 daily  His blood sugars overall are better with continued weight loss and benefiting from  trying to modify his diet further He tends to have high readings fasting and explained that this is related to increased glucose output overnight from his liver stores   PLAN:    Continue Xigduo 06/998 only 1 tablet daily However he continues to have mostly high readings in the mornings over 130 we can increase the dose to 2 tablets  Again recommended seeing the dietitian but he wants to make his own meal plans  Microalbumin to be checked at his upcoming physical with PCP  Follow-up: 3 months    There are no Patient Instructions on file for this visit.     Elayne Snare 12/05/2020, 10:28 AM   Note: This office note was prepared with Dragon voice recognition system technology. Any transcriptional errors that result from this process are unintentional.

## 2020-12-05 NOTE — Patient Instructions (Signed)
Check blood sugars on waking up 3 days a week  Also check blood sugars about 2 hours after meals and do this after different meals by rotation  Recommended blood sugar levels on waking up are 90-130 and about 2 hours after meal is 130-160  Please bring your blood sugar monitor to each visit, thank you   

## 2020-12-25 ENCOUNTER — Other Ambulatory Visit: Payer: Self-pay | Admitting: Endocrinology

## 2021-01-01 ENCOUNTER — Other Ambulatory Visit: Payer: Medicare HMO | Admitting: Internal Medicine

## 2021-01-01 ENCOUNTER — Other Ambulatory Visit: Payer: Self-pay

## 2021-01-01 DIAGNOSIS — E119 Type 2 diabetes mellitus without complications: Secondary | ICD-10-CM

## 2021-01-01 DIAGNOSIS — Z125 Encounter for screening for malignant neoplasm of prostate: Secondary | ICD-10-CM

## 2021-01-01 DIAGNOSIS — Z0000 Encounter for general adult medical examination without abnormal findings: Secondary | ICD-10-CM

## 2021-01-01 DIAGNOSIS — E786 Lipoprotein deficiency: Secondary | ICD-10-CM | POA: Diagnosis not present

## 2021-01-01 DIAGNOSIS — R7309 Other abnormal glucose: Secondary | ICD-10-CM

## 2021-01-02 LAB — CBC WITH DIFFERENTIAL/PLATELET
Absolute Monocytes: 680 cells/uL (ref 200–950)
Basophils Absolute: 53 cells/uL (ref 0–200)
Basophils Relative: 0.8 %
Eosinophils Absolute: 158 cells/uL (ref 15–500)
Eosinophils Relative: 2.4 %
HCT: 47.3 % (ref 38.5–50.0)
Hemoglobin: 15.7 g/dL (ref 13.2–17.1)
Lymphs Abs: 2237 cells/uL (ref 850–3900)
MCH: 29.1 pg (ref 27.0–33.0)
MCHC: 33.2 g/dL (ref 32.0–36.0)
MCV: 87.8 fL (ref 80.0–100.0)
MPV: 9.8 fL (ref 7.5–12.5)
Monocytes Relative: 10.3 %
Neutro Abs: 3472 cells/uL (ref 1500–7800)
Neutrophils Relative %: 52.6 %
Platelets: 247 10*3/uL (ref 140–400)
RBC: 5.39 10*6/uL (ref 4.20–5.80)
RDW: 11.8 % (ref 11.0–15.0)
Total Lymphocyte: 33.9 %
WBC: 6.6 10*3/uL (ref 3.8–10.8)

## 2021-01-02 LAB — LIPID PANEL
Cholesterol: 152 mg/dL (ref <200)
HDL: 47 mg/dL (ref >=40)
LDL Cholesterol (Calc): 91 mg/dL (calc)
Non-HDL Cholesterol (Calc): 105 mg/dL (calc) (ref <130)
Total CHOL/HDL Ratio: 3.2 (calc) (ref <5.0)
Triglycerides: 57 mg/dL (ref <150)

## 2021-01-02 LAB — PSA: PSA: 0.65 ng/mL (ref <=4.00)

## 2021-01-06 ENCOUNTER — Encounter: Payer: Self-pay | Admitting: Internal Medicine

## 2021-01-06 ENCOUNTER — Other Ambulatory Visit: Payer: Self-pay

## 2021-01-06 ENCOUNTER — Ambulatory Visit (INDEPENDENT_AMBULATORY_CARE_PROVIDER_SITE_OTHER): Payer: Medicare HMO | Admitting: Internal Medicine

## 2021-01-06 VITALS — BP 130/70 | HR 69 | Temp 97.8°F | Ht 70.5 in | Wt 191.0 lb

## 2021-01-06 DIAGNOSIS — R319 Hematuria, unspecified: Secondary | ICD-10-CM | POA: Diagnosis not present

## 2021-01-06 DIAGNOSIS — E119 Type 2 diabetes mellitus without complications: Secondary | ICD-10-CM

## 2021-01-06 DIAGNOSIS — E786 Lipoprotein deficiency: Secondary | ICD-10-CM | POA: Diagnosis not present

## 2021-01-06 DIAGNOSIS — Z0000 Encounter for general adult medical examination without abnormal findings: Secondary | ICD-10-CM | POA: Diagnosis not present

## 2021-01-06 LAB — POCT URINALYSIS DIPSTICK
Bilirubin, UA: NEGATIVE
Glucose, UA: POSITIVE — AB
Leukocytes, UA: NEGATIVE
Nitrite, UA: NEGATIVE
Protein, UA: NEGATIVE
Spec Grav, UA: 1.02 (ref 1.010–1.025)
Urobilinogen, UA: 0.2 E.U./dL
pH, UA: 6 (ref 5.0–8.0)

## 2021-01-06 NOTE — Progress Notes (Signed)
Annual Wellness Visit     Patient: Steven Wheeler, Male    DOB: [birth year], 70 y.o.   MRN: [id number] Visit Date: 01/06/2021  Chief Complaint  Patient presents with   Medicare Wellness   Subjective    Steven Wheeler is a 70 y.o. male who presents today for his Annual Wellness Visit, health maintenance exam and evaluation of medical issues.  HPI Patient has history of type 2 diabetes mellitus.  His wife passed away due to complications of breast cancer in 2020.  Over the past couple of years, his glucose control has not been as good as I would like to see it.  He was referred to Dr. Dwyane Dee and saw him initially in April 2022.  Hemoglobin A1c in May was 7.2%.  In July hemoglobin A1c was 7.5% and in October hemoglobin A1c was 6.9%.  He is currently on Xigudo XR 06-998.  He also takes statin medication.  Suggested coronary calcium scoring due to family history of coronary disease and risk factors.  Wisdom teeth extraction at approximately age 43.  Hepatitis A around 1982.  History of fractured left arm at age 25.  Up until recently has not wanted to be on statin medication.  History of type 2 diabetes mellitus, hypertriglyceridemia, borderline hypertension and metabolic syndrome.  Social history: Daughter living with him who has grandchild.  He is enjoying spending time with them.  He actually has 2 adult daughters.  He is a retired Water engineer.  Non-smoker.  Social alcohol consumption.  Family history: Father died of an MI.  Mother passed away of complications of septicemia in her late 24s with history of dementia, congestive heart failure and vision loss.  1 brother in good health.  3 sisters-1 died of cancer.  2 sisters living one of them has hypertension and claudication symptoms.  Had colonoscopy by Dr. Carlean Purl in October and said follow-up was not needed.       Patient Care Team: Steven Showers, MD as PCP - General (Internal Medicine)  Review of Systems  Vaccines are up to date. Colonoscopy done in October and Dr. Carlean Purl says not to repeat.   Objective    Vital signs blood pressure 130/70 pulse 69 regular temperature 97.8 degrees pulse oximetry 98% weight 191 pounds BMI 27.02  Physical Exam  Skin: Warm and dry.  No cervical adenopathy.  No thyromegaly.  No carotid bruits.  Chest is clear to auscultation.  Cardiac exam: Regular rate and rhythm without ectopy.  Abdomen is soft nondistended without hepatosplenomegaly masses or tenderness.  Neurological exam is intact without focal deficits.  Affect thought and judgment are normal.  No lower extremity edema or deformity.  Prostate is normal without masses.  Affect thought and judgment are normal.   Most recent functional status assessment: In your present state of health, do you have any difficulty performing the following activities: 01/06/2021  Hearing? N  Vision? N  Difficulty concentrating or making decisions? N  Walking or climbing stairs? N  Dressing or bathing? N  Doing errands, shopping? N  Preparing Food and eating ? N  Using the Toilet? N  In the past six months, have you accidently leaked urine? N  Do you have problems with loss of bowel control? N  Managing your Medications? N  Managing your Finances? N  Housekeeping or managing your Housekeeping? N  Some recent data might be hidden   Most recent fall risk assessment: Fall Risk  01/06/2021  Falls  in the past year? 0  Number falls in past yr: 0  Injury with Fall? 0  Risk for fall due to : No Fall Risks  Follow up Falls evaluation completed    Most recent depression screenings: PHQ 2/9 Scores 01/06/2021 11/13/2019  PHQ - 2 Score 0 0   Most recent cognitive screening: 6CIT Screen 01/06/2021  What Year? 0 points  What month? 0 points  What time? 0 points  Count back from 20 0 points  Months in reverse 0 points  Repeat phrase 0 points  Total Score 0       Assessment & Plan     Annual wellness visit done  today including the all of the following: Reviewed patient's Family Medical History Reviewed and updated list of patient's medical providers Assessment of cognitive impairment was done Assessed patient's functional ability Established a written schedule for health screening [place] Completed and Reviewed  Discussed health benefits of physical activity, and encouraged him to engage in regular exercise appropriate for his age and condition.         IElby Showers, MD, have reviewed all documentation for this visit. The documentation on 01/22/21 for the exam, diagnosis, procedures, and orders are all accurate and complete.  Impression:   Type 2 diabetes mellitus-improved diabetic control with Dr. Ronnie Derby assistance  Health maintenance: Due to family history of coronary artery disease and his history of diabetes I recommended coronary calcium scoring.  He has been reluctant to take statin medication as lipids have always been within normal limits for the most part.  History of low HDL cholesterol  Health maintenance: Has had COVID booster and flu vaccine.  Pneumococcal immunizations are up-to-date as is Tdap.  Has had Shingrix vaccine as well.  Has had colonoscopy and does not need another one per Dr. Carlean Purl.  He will have coronary calcium scoring.    Have booked repeat physical exam in 1 year.  Continue follow-up with Dr. Dwyane Dee.    He had trace blood on urine dipstick but urine microscopic was unremarkable. }   Angus Seller, CMA

## 2021-01-06 NOTE — Patient Instructions (Addendum)
Continue to see Dr. Dwyane Dee. Continue same meds and RTC here in one year or as needed. It was a pleasure to see you today. Immunizations are up to date.  Have coronary calcium scoring.

## 2021-01-07 LAB — MICROALBUMIN / CREATININE URINE RATIO
Creatinine, Urine: 80 mg/dL (ref 20–320)
Microalb, Ur: <0.2 mg/dL

## 2021-01-07 LAB — URINALYSIS, MICROSCOPIC ONLY
Bacteria, UA: NONE SEEN /HPF
Hyaline Cast: NONE SEEN /LPF
RBC / HPF: NONE SEEN /HPF (ref 0–2)
Squamous Epithelial / HPF: NONE SEEN /HPF (ref <=5)
WBC, UA: NONE SEEN /HPF (ref 0–5)

## 2021-01-28 ENCOUNTER — Encounter (HOSPITAL_COMMUNITY): Payer: Self-pay

## 2021-01-28 ENCOUNTER — Other Ambulatory Visit: Payer: Self-pay

## 2021-01-28 ENCOUNTER — Ambulatory Visit (HOSPITAL_COMMUNITY)
Admission: RE | Admit: 2021-01-28 | Discharge: 2021-01-28 | Disposition: A | Discharge status: Home / Self Care | Payer: Medicare HMO | Source: Ambulatory Visit | Attending: Internal Medicine | Admitting: Internal Medicine

## 2021-01-28 DIAGNOSIS — Z8639 Personal history of other endocrine, nutritional and metabolic disease: Secondary | ICD-10-CM | POA: Insufficient documentation

## 2021-01-28 DIAGNOSIS — Z136 Encounter for screening for cardiovascular disorders: Secondary | ICD-10-CM | POA: Insufficient documentation

## 2021-01-28 DIAGNOSIS — Z8679 Personal history of other diseases of the circulatory system: Secondary | ICD-10-CM | POA: Insufficient documentation

## 2021-01-29 NOTE — Progress Notes (Signed)
Appointment made

## 2021-02-02 ENCOUNTER — Ambulatory Visit: Payer: Medicare HMO | Admitting: Internal Medicine

## 2021-02-10 ENCOUNTER — Ambulatory Visit (INDEPENDENT_AMBULATORY_CARE_PROVIDER_SITE_OTHER): Payer: Medicare HMO | Admitting: Internal Medicine

## 2021-02-10 ENCOUNTER — Encounter: Payer: Self-pay | Admitting: Internal Medicine

## 2021-02-10 ENCOUNTER — Other Ambulatory Visit: Payer: Self-pay

## 2021-02-10 VITALS — BP 132/62 | HR 69 | Temp 97.5°F | Ht 70.5 in | Wt 188.0 lb

## 2021-02-10 DIAGNOSIS — E119 Type 2 diabetes mellitus without complications: Secondary | ICD-10-CM

## 2021-02-10 DIAGNOSIS — R931 Abnormal findings on diagnostic imaging of heart and coronary circulation: Secondary | ICD-10-CM | POA: Diagnosis not present

## 2021-02-10 DIAGNOSIS — Z8249 Family history of ischemic heart disease and other diseases of the circulatory system: Secondary | ICD-10-CM

## 2021-02-10 MED ORDER — ROSUVASTATIN CALCIUM 20 MG PO TABS: 20.0000 mg | ORAL_TABLET | Freq: Every day | ORAL | 3 refills | Status: DC

## 2021-02-10 NOTE — Patient Instructions (Signed)
Referral to Cardiology for elevated Coronary calicium score. Start Crestor 20 mg daily.

## 2021-02-10 NOTE — Progress Notes (Signed)
° °  Subjective:    Patient ID: Steven Wheeler, male    DOB: 11-16-1950, 70 y.o.   MRN: [id number]  HPI 70 year old Male seen for discussion of Coronary Calcium score. He has previously refused to take statin medication believing it was harmful. He has Type 2 diabetes mellitus and family history of heart disease in both parents.  Father died of an MI.  Mother passed away of complications of septicemia in her late 61s with history of dementia, congestive heart failure and vision loss.  Sister with peripheral artery disease.  He saw Dr. Dwyane Dee upon my referral recently and is now on Xigduo XR 06-998 mg daily.  Hemoglobin A1c 6.9% in October and was 7.5% in July.  He has been walking 3 miles daily.  He never did see dietitian he says because it took 2 weeks to get an appointment through Endocrinology but has been seeing a chiropractor who does some diet counseling.  His recent coronary calcium score was 2516 which she is in the 95th percentile.  He had no significant aortic atherosclerosis.  He has no chest pain or shortness of breath.  However, I think he would benefit from cardiology consultation  I had a frank discussion with him today and I am placing him on Crestor 20 mg daily.  He is a widower.  Wife died of complications of cancer.  In February his HDL was low at 29.  On November 10 it was increased to 47.  In 2019 it was 35.  In 2021 it was 42.  Review of Systems See above    Objective:   Physical Exam Vital signs reviewed.  Chest is clear to auscultation.  Cardiac exam: Regular rate and rhythm without ectopy.  Extremities no edema.       Assessment & Plan:  Elevated coronary calcium score with history of diabetes mellitus and family history of heart disease  Plan: Cardiology referral.He may need non-invasive imaging but is asymtomatic.  Start Crestor 20 mg daily.  Continue follow-up with Dr. Dwyane Dee for diabetes mellitus

## 2021-03-11 ENCOUNTER — Other Ambulatory Visit (INDEPENDENT_AMBULATORY_CARE_PROVIDER_SITE_OTHER): Payer: Medicare HMO

## 2021-03-11 ENCOUNTER — Other Ambulatory Visit: Payer: Self-pay

## 2021-03-11 DIAGNOSIS — E1165 Type 2 diabetes mellitus with hyperglycemia: Secondary | ICD-10-CM | POA: Diagnosis not present

## 2021-03-11 LAB — BASIC METABOLIC PANEL
BUN: 27 mg/dL — ABNORMAL HIGH (ref 6–23)
CO2: 26 mEq/L (ref 19–32)
Calcium: 9.4 mg/dL (ref 8.4–10.5)
Chloride: 102 mEq/L (ref 96–112)
Creatinine, Ser: 1 mg/dL (ref 0.40–1.50)
GFR: 75.53 mL/min (ref >60.00)
Glucose, Bld: 130 mg/dL — ABNORMAL HIGH (ref 70–99)
Potassium: 4.8 mEq/L (ref 3.5–5.1)
Sodium: 138 mEq/L (ref 135–145)

## 2021-03-11 LAB — HEMOGLOBIN A1C: Hgb A1c MFr Bld: 7.3 % — ABNORMAL HIGH (ref 4.6–6.5)

## 2021-03-12 NOTE — Progress Notes (Signed)
CARDIOLOGY CONSULT NOTE       Patient ID: Steven Wheeler MRN: [id number] DOB/AGE: [birth year] 72 y.o.  Admit date: (Not on file) Referring Physician: [facility] Primary Physician: Elby Showers, MD Primary Cardiologist: New Reason for Consultation: CAD  Active Problems:   * No active hospital problems. *   HPI:  71 y.o. with history of DM and family history of premature CAD with father dying of an MI.. No history of vascular/cardiac disease Had coronary calcium score done 01/28/21 Reviewed Severe LM and 3 vessel calcium total score 2516 which was 95 th percentile for age/sex. Historically his A1c has been poorly controlled ranging from 8.9-> current 7.3.  His LDL was 91 prior to starting Crestor on 02/10/21 for his high calcium score  He walks 3 miles daily Has no chest pain, dyspnea palpitations or syncope  He is a widower Wife died of cancer complications He is retired from Geologist, engineering Has two kids and a grand child near by Does a lot of work with his brother Economist. Has two remaining sisters as well   No chest pain   Some myalgias with crestor but tolerable ? Made A1c go up but he chested a lot With his diet over holidays   ROS All other systems reviewed and negative except as noted above  Past Medical History:  Diagnosis Date   Diabetes mellitus without complication ([place])    Diverticulosis    2009 Virtual colonoscopy   Hx of adenomatous polyp of colon 11/2020   6 mm - no routine repeat colonoscopy needed    Family History  Problem Relation Age of Onset   Heart disease Father    Diabetes Father    Obesity Father    Breast cancer Sister    Cancer Sister    Kidney Stones Brother    Colon cancer Neg Hx    Colon polyps Neg Hx    Esophageal cancer Neg Hx    Stomach cancer Neg Hx    Rectal cancer Neg Hx     Social History   Socioeconomic History   Marital status: Widowed    Spouse name: Not on file   Number of children: Not on file   Years  of education: Not on file   Highest education level: Not on file  Occupational History   Not on file  Tobacco Use   Smoking status: Never   Smokeless tobacco: Never  Vaping Use   Vaping Use: Never used  Substance and Sexual Activity   Alcohol use: Yes    Comment: rare once a month maybe   Drug use: No   Sexual activity: Not on file  Other Topics Concern   Not on file  Social History Narrative   Not on file   Social Determinants of Health   Financial Resource Strain: Not on file  Food Insecurity: Not on file  Transportation Needs: Not on file  Physical Activity: Not on file  Stress: Not on file  Social Connections: Not on file  Intimate Partner Violence: Not on file    Past Surgical History:  Procedure Laterality Date   virtual colonoscopy  2009   tics   WISDOM TOOTH EXTRACTION        Current Outpatient Medications:    ARGININE PO, Take by mouth., Disp: , Rfl:    Barberry-Oreg Grape-Goldenseal (BERBERINE COMPLEX PO), Take by mouth., Disp: , Rfl:    Blood Glucose Monitoring Suppl (ONETOUCH VERIO FLEX SYSTEM) w/Device KIT, Use to check  blood sugar once daily  Dx code E11.9, Disp: 1 kit, Rfl: 0   Cholecalciferol (VITAMIN D PO), Take by mouth., Disp: , Rfl:    CINNAMON PO, Take by mouth., Disp: , Rfl:    co-enzyme Q-10 50 MG capsule, Take 50 mg by mouth daily., Disp: , Rfl:    Dapagliflozin-metFORMIN HCl ER (XIGDUO XR) 06-998 MG TB24, Take 2 tablets by mouth daily., Disp: 60 tablet, Rfl: 3   KRILL OIL OMEGA-3 PO, Take by mouth., Disp: , Rfl:    Lancets (ONETOUCH DELICA PLUS [id number]) MISC, Use to check blood sugar once a day Dx code E11.9, Disp: 50 each, Rfl: 3   Multiple Vitamin (MULTIVITAMIN) tablet, Take 1 tablet by mouth daily., Disp: , Rfl:    NON FORMULARY, Tumeric, berberine?, Disp: , Rfl:    NON FORMULARY, 1 capsule daily at 2 am. Cholestrol complete, Disp: , Rfl:    ONETOUCH VERIO test strip, USE AS INSTRUCTED TO CHECK BLOOD SUGAR ONCE A DAY DX CODE E11.9, Disp:  50 strip, Rfl: 2   rosuvastatin (CRESTOR) 20 MG tablet, Take 1 tablet (20 mg total) by mouth daily., Disp: 90 tablet, Rfl: 3    Physical Exam: Blood pressure (!) 150/68, pulse (!) 58, height 5' 11.5" (1.816 m), weight 188 lb (85.3 kg), SpO2 96 %.    Affect appropriate Healthy:  appears stated age 71: normal Neck supple with no adenopathy JVP normal no bruits no thyromegaly Lungs clear with no wheezing and good diaphragmatic motion Heart:  S1/S2 no murmur, no rub, gallop or click PMI normal Abdomen: benighn, BS positve, no tenderness, no AAA no bruit.  No HSM or HJR Distal pulses intact with no bruits No edema Neuro non-focal Skin warm and dry No muscular weakness   Labs:   Lab Results  Component Value Date   WBC 6.6 01/01/2021   HGB 15.7 01/01/2021   HCT 47.3 01/01/2021   MCV 87.8 01/01/2021   PLT 247 01/01/2021    Recent Labs  Lab 03/11/21 0758  NA 138  K 4.8  CL 102  CO2 26  BUN 27*  CREATININE 1.00  CALCIUM 9.4  GLUCOSE 130*   No results found for: CKTOTAL, CKMB, CKMBINDEX, TROPONINI  Lab Results  Component Value Date   CHOL 152 01/01/2021   CHOL 111 04/15/2020   CHOL 130 11/12/2019   Lab Results  Component Value Date   HDL 47 01/01/2021   HDL 29 (L) 04/15/2020   HDL 42 11/12/2019   Lab Results  Component Value Date   LDLCALC 91 01/01/2021   LDLCALC 65 04/15/2020   LDLCALC 69 11/12/2019   Lab Results  Component Value Date   TRIG 57 01/01/2021   TRIG 90 04/15/2020   TRIG 100 11/12/2019   Lab Results  Component Value Date   CHOLHDL 3.2 01/01/2021   CHOLHDL 3.8 04/15/2020   CHOLHDL 3.1 11/12/2019   No results found for: LDLDIRECT    Radiology: No results found.  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 Target LDL < 55 DM:  Discussed low carb diet  target  hemoglobin A1c is 6.5 or less.  Continue current medications. Follows with Dr Dwyane Dee  EX Myovue F/U in a year if non ischemic  Signed: Jenkins Rouge 03/17/2021, 9:59 AM

## 2021-03-13 ENCOUNTER — Ambulatory Visit (INDEPENDENT_AMBULATORY_CARE_PROVIDER_SITE_OTHER): Payer: Medicare HMO | Admitting: Endocrinology

## 2021-03-13 ENCOUNTER — Other Ambulatory Visit: Payer: Self-pay

## 2021-03-13 ENCOUNTER — Encounter: Payer: Self-pay | Admitting: Endocrinology

## 2021-03-13 VITALS — BP 126/68 | HR 74 | Ht 71.5 in | Wt 188.2 lb

## 2021-03-13 DIAGNOSIS — E1165 Type 2 diabetes mellitus with hyperglycemia: Secondary | ICD-10-CM

## 2021-03-13 MED ORDER — XIGDUO XR 5-1000 MG PO TB24: 2.0000 | ORAL_TABLET | Freq: Every day | ORAL | 3 refills | Status: DC

## 2021-03-13 NOTE — Patient Instructions (Signed)
Check blood sugars on waking up 4  days a week  Also check blood sugars about 2 hours after meals and do this after different meals by rotation  Recommended blood sugar levels on waking up are 90-130 and about 2 hours after meal is 130-160  Please bring your blood sugar monitor to each visit, thank you  

## 2021-03-13 NOTE — Progress Notes (Signed)
Patient ID: Steven Wheeler, male   DOB: 1950/04/11, 71 y.o.   MRN: [id number]           Reason for Appointment: Follow-up for Type 2 Diabetes  Referring PCP: [facility]   History of Present Illness:          Date of diagnosis of type 2 diabetes mellitus:  2017      Background history:  He was asymptomatic at the time of diagnosis with A1c 6.9 He was started on metformin 500 mg twice daily which has been continued unchanged since then Has not been on any other medications  Recent history:   A1c is 7.3, was 6.9   Non-insulin hypoglycemic drugs the patient is taking are: Xigduo 06/998, 1 tablet daily  Current management, blood sugar patterns and problems identified: He has been taking Crestor since 12/20 and he thinks his blood sugars significantly increased after that  However his blood sugars in early December ranged from 142 up to 182 and appeared to be higher from the end of November Most of his blood sugars appear to be mostly higher fasting  Recently not checking readings after dinner  Previously since his blood sugar relatively better with continuing to have good lifestyle routine his Merleen Nicely was continued at the low-dose of 1 tablet daily  He generally still able to continue his walking  At least recently he thinks he is doing very well on his diet again but cutting back on carbohydrate and calories His blood sugar meter is new from last year        Side effects from medications have been: None     Typical meal intake: Breakfast is cereal or oatmeal, bacon/eggs.  Usually fast food at dinnertime.  Snacks will be fruit, trail mix and popcorn               Exercise:  Walking 3 miles 6 days a week  Glucose monitoring:  About once a day      Glucometer: Verio     Blood Glucose readings by time of day and averages from meter download for the last 30 days:   PRE-MEAL Fasting Lunch Dinner Bedtime Overall  Glucose range: 147-171 119-195     Mean/median: 160 158 121  149    POST-MEAL PC Breakfast PC Lunch PC Dinner  Glucose range:     Mean/median:      Previously:  PRE-MEAL Fasting Lunch Dinner Bedtime Overall  Glucose range:     106-204  Mean/median: 135 136   140   POST-MEAL PC Breakfast PC Lunch PC Dinner  Glucose range:     Mean/median:  148    Previously:  PRE-MEAL Fasting Lunch Dinner Bedtime Overall  Glucose range: 123-159    109-194  Mean/median: 146   127 147   POST-MEAL PC Breakfast PC Lunch PC Dinner  Glucose range:     Mean/median: 151 160     Dietician visit, most recent: 2019  Weight history:  Wt Readings from Last 3 Encounters:  03/13/21 188 lb 3.2 oz (85.4 kg)  02/10/21 188 lb (85.3 kg)  01/06/21 191 lb (86.6 kg)    Glycemic control:   Lab Results  Component Value Date   HGBA1C 7.3 (H) 03/11/2021   HGBA1C 6.9 (H) 12/03/2020   HGBA1C 7.5 (H) 08/29/2020   Lab Results  Component Value Date   MICROALBUR <0.2 01/06/2021   LDLCALC 91 01/01/2021   CREATININE 1.00 03/11/2021   Lab Results  Component Value Date  MICRALBCREAT NOTE 01/06/2021    Lab Results  Component Value Date   FRUCTOSAMINE 316 (H) 07/14/2020    Lab on 03/11/2021  Component Date Value Ref Range Status   Sodium 03/11/2021 138  135 - 145 mEq/L Final   Potassium 03/11/2021 4.8  3.5 - 5.1 mEq/L Final   Chloride 03/11/2021 102  96 - 112 mEq/L Final   CO2 03/11/2021 26  19 - 32 mEq/L Final   Glucose, Bld 03/11/2021 130 (H)  70 - 99 mg/dL Final   BUN 03/11/2021 27 (H)  6 - 23 mg/dL Final   Creatinine, Ser 03/11/2021 1.00  0.40 - 1.50 mg/dL Final   GFR 03/11/2021 75.53  >60.00 mL/min Final   Calculated using the CKD-EPI Creatinine Equation (2021)   Calcium 03/11/2021 9.4  8.4 - 10.5 mg/dL Final   Hgb A1c MFr Bld 03/11/2021 7.3 (H)  4.6 - 6.5 % Final   Glycemic Control Guidelines for People with Diabetes:Non Diabetic:  <6%Goal of Therapy: <7%Additional Action Suggested:  >8%     Allergies as of 03/13/2021   No Known Allergies       Medication List        Accurate as of March 13, 2021 11:39 AM. If you have any questions, ask your nurse or doctor.          ARGININE PO Take by mouth.   BERBERINE COMPLEX PO Take by mouth.   CINNAMON PO Take by mouth.   co-enzyme Q-10 50 MG capsule Take 50 mg by mouth daily.   KRILL OIL OMEGA-3 PO Take by mouth.   multivitamin tablet Take 1 tablet by mouth daily.   NON FORMULARY Tumeric, berberine?   NON FORMULARY 1 capsule daily at 2 am. Cholestrol complete   OneTouch Delica Plus [id number] Misc Use to check blood sugar once a day Dx code E11.9   OneTouch Verio Flex System w/Device Kit Use to check blood sugar once daily  Dx code E11.9   OneTouch Verio test strip Generic drug: glucose blood USE AS INSTRUCTED TO CHECK BLOOD SUGAR ONCE A DAY DX CODE E11.9   rosuvastatin 20 MG tablet Commonly known as: Crestor Take 1 tablet (20 mg total) by mouth daily.   VITAMIN D PO Take by mouth.   Xigduo XR 06-998 MG Tb24 Generic drug: Dapagliflozin-metFORMIN HCl ER Take 1 tablet by mouth daily.        Allergies: No Known Allergies  Past Medical History:  Diagnosis Date   Diabetes mellitus without complication ([place])    Diverticulosis    2009 Virtual colonoscopy   Hx of adenomatous polyp of colon 11/2020   6 mm - no routine repeat colonoscopy needed    Past Surgical History:  Procedure Laterality Date   virtual colonoscopy  2009   tics   WISDOM TOOTH EXTRACTION      Family History  Problem Relation Age of Onset   Heart disease Father    Diabetes Father    Obesity Father    Breast cancer Sister    Cancer Sister    Kidney Stones Brother    Colon cancer Neg Hx    Colon polyps Neg Hx    Esophageal cancer Neg Hx    Stomach cancer Neg Hx    Rectal cancer Neg Hx     Social History:  reports that he has never smoked. He has never used smokeless tobacco. He reports current alcohol use. He reports that he does not use drugs.   Review of  Systems  Lipid history: Has not had hypercholesterolemia.    Since he had a high calcium score he is now taking Crestor 20 mg daily since 02/10/2021 from his PCP  Lab Results  Component Value Date   CHOL 152 01/01/2021   CHOL 111 04/15/2020   CHOL 130 11/12/2019   Lab Results  Component Value Date   HDL 47 01/01/2021   HDL 29 (L) 04/15/2020   HDL 42 11/12/2019   Lab Results  Component Value Date   LDLCALC 91 01/01/2021   LDLCALC 65 04/15/2020   LDLCALC 69 11/12/2019   Lab Results  Component Value Date   TRIG 57 01/01/2021   TRIG 90 04/15/2020   TRIG 100 11/12/2019   Lab Results  Component Value Date   CHOLHDL 3.2 01/01/2021   CHOLHDL 3.8 04/15/2020   CHOLHDL 3.1 11/12/2019   No results found for: LDLDIRECT          No history of hypertension:  BP Readings from Last 3 Encounters:  03/13/21 126/68  02/10/21 132/62  01/06/21 130/70    Most recent eye exam was in 2/22, normal  Most recent foot exam: 4/22  Currently known complications of diabetes: None  LABS:  Lab on 03/11/2021  Component Date Value Ref Range Status   Sodium 03/11/2021 138  135 - 145 mEq/L Final   Potassium 03/11/2021 4.8  3.5 - 5.1 mEq/L Final   Chloride 03/11/2021 102  96 - 112 mEq/L Final   CO2 03/11/2021 26  19 - 32 mEq/L Final   Glucose, Bld 03/11/2021 130 (H)  70 - 99 mg/dL Final   BUN 03/11/2021 27 (H)  6 - 23 mg/dL Final   Creatinine, Ser 03/11/2021 1.00  0.40 - 1.50 mg/dL Final   GFR 03/11/2021 75.53  >60.00 mL/min Final   Calculated using the CKD-EPI Creatinine Equation (2021)   Calcium 03/11/2021 9.4  8.4 - 10.5 mg/dL Final   Hgb A1c MFr Bld 03/11/2021 7.3 (H)  4.6 - 6.5 % Final   Glycemic Control Guidelines for People with Diabetes:Non Diabetic:  <6%Goal of Therapy: <7%Additional Action Suggested:  >8%     Physical Examination:  BP 126/68    Pulse 74    Ht 5' 11.5" (1.816 m)    Wt 188 lb 3.2 oz (85.4 kg)    SpO2 95%    BMI 25.88 kg/m       ASSESSMENT:  Diabetes  type 2, non-insulin-dependent  See history of present illness for detailed discussion of current diabetes management, blood sugar patterns and problems identified  A1c is 7.3  Current treatment regimen is Xigduo 06/998 daily  His blood sugars overall are higher and review of his blood sugars indicate that he started going up in late November  This likely indicates some progression with diabetes rather than a cause-and-effect relationship with Crestor Reports that recently he is still walking 5 days a week and generally watching his diet No unusual weight gain    PLAN:   He will go up to 2 tablets daily of the Xigduo, he can take both daily at the same time  Check periodic readings after supper and discussed blood sugar targets both morning and after meals  To call if blood sugars are not consistently controlled otherwise follow-up in 3 months  Follow-up: 3 months    Patient Instructions  Check blood sugars on waking up 4 days a week  Also check blood sugars about 2 hours after meals and do this after different meals  by rotation  Recommended blood sugar levels on waking up are 90-130 and about 2 hours after meal is 130-160  Please bring your blood sugar monitor to each visit, thank you      Elayne Snare 03/13/2021, 11:39 AM   Note: This office note was prepared with Dragon voice recognition system technology. Any transcriptional errors that result from this process are unintentional.

## 2021-03-17 ENCOUNTER — Ambulatory Visit: Payer: Medicare HMO | Admitting: Cardiovascular Disease

## 2021-03-17 ENCOUNTER — Other Ambulatory Visit: Payer: Self-pay

## 2021-03-17 ENCOUNTER — Encounter: Payer: Self-pay | Admitting: Cardiovascular Disease

## 2021-03-17 VITALS — BP 150/68 | HR 58 | Ht 71.5 in | Wt 188.0 lb

## 2021-03-17 DIAGNOSIS — E782 Mixed hyperlipidemia: Secondary | ICD-10-CM

## 2021-03-17 DIAGNOSIS — R931 Abnormal findings on diagnostic imaging of heart and coronary circulation: Secondary | ICD-10-CM | POA: Diagnosis not present

## 2021-03-17 DIAGNOSIS — E118 Type 2 diabetes mellitus with unspecified complications: Secondary | ICD-10-CM

## 2021-03-17 DIAGNOSIS — I2510 Atherosclerotic heart disease of native coronary artery without angina pectoris: Secondary | ICD-10-CM | POA: Diagnosis not present

## 2021-03-17 NOTE — Patient Instructions (Signed)
Medication Instructions:   *If you need a refill on your cardiac medications before your next appointment, please call your pharmacy*   Lab Work: If you have labs (blood work) drawn today and your tests are completely normal, you will receive your results only by: [place] (if you have MyChart) OR A paper copy in the mail If you have any lab test that is abnormal or we need to change your treatment, we will call you to review the results.  Testing/Procedures: Your physician has requested that you have en exercise stress myoview. For further information please visit [email or web address]. Please follow instruction sheet, as given.  Follow-Up: At [facility], you and your health needs are our priority.  As part of our continuing mission to provide you with exceptional heart care, we have created designated Provider Care Teams.  These Care Teams include your primary Cardiologist (physician) and Advanced Practice Providers (APPs -  Physician Assistants and Nurse Practitioners) who all work together to provide you with the care you need, when you need it.  We recommend signing up for the patient portal called "MyChart".  Sign up information is provided on this After Visit Summary.  MyChart is used to connect with patients for Virtual Visits (Telemedicine).  Patients are able to view lab/test results, encounter notes, upcoming appointments, etc.  Non-urgent messages can be sent to your provider as well.   To learn more about what you can do with MyChart, go to [email or web address].    Your next appointment:   1 year(s)  The format for your next appointment:   In Person  Provider:   Jenkins Rouge, MD {

## 2021-03-19 ENCOUNTER — Telehealth (HOSPITAL_COMMUNITY): Payer: Self-pay

## 2021-03-19 NOTE — Telephone Encounter (Signed)
Detailed instructions left on the the patient's answering machine. Asked to call back with any questions. S.Carmen Tolliver EMTP

## 2021-03-24 ENCOUNTER — Ambulatory Visit (HOSPITAL_COMMUNITY): Payer: Medicare HMO | Attending: Cardiovascular Disease

## 2021-03-24 ENCOUNTER — Other Ambulatory Visit: Payer: Self-pay

## 2021-03-24 DIAGNOSIS — R931 Abnormal findings on diagnostic imaging of heart and coronary circulation: Secondary | ICD-10-CM | POA: Diagnosis not present

## 2021-03-24 DIAGNOSIS — I2510 Atherosclerotic heart disease of native coronary artery without angina pectoris: Secondary | ICD-10-CM | POA: Diagnosis not present

## 2021-03-24 LAB — MYOCARDIAL PERFUSION IMAGING
Angina Index: 0
Duke Treadmill Score: 2
Estimated workload: 11
Exercise duration (min): 9 min
Exercise duration (sec): 35 s
LV dias vol: 83 mL (ref 62–150)
LV sys vol: 31 mL
MPHR: 149 {beats}/min
Nuc Stress EF: 62 %
Peak HR: 162 {beats}/min
Percent HR: 108 %
Rest HR: 82 {beats}/min
Rest Nuclear Isotope Dose: 10.7 mCi
SDS: 0
SRS: 0
SSS: 0
ST Elevation (mm): 1.5 mm
Stress Nuclear Isotope Dose: 32.2 mCi
TID: 1.04

## 2021-03-24 MED ORDER — TECHNETIUM TC 99M TETROFOSMIN IV KIT
10.7000 | PACK | Freq: Once | INTRAVENOUS | Status: AC | PRN
  Administered 2021-03-24: 10.7 via INTRAVENOUS
  Filled 2021-03-24: qty 11

## 2021-03-24 MED ORDER — TECHNETIUM TC 99M TETROFOSMIN IV KIT
32.2000 | PACK | Freq: Once | INTRAVENOUS | Status: AC | PRN
  Filled 2021-03-24: qty 33

## 2021-06-15 ENCOUNTER — Other Ambulatory Visit (INDEPENDENT_AMBULATORY_CARE_PROVIDER_SITE_OTHER): Payer: Medicare HMO

## 2021-06-15 DIAGNOSIS — E1165 Type 2 diabetes mellitus with hyperglycemia: Secondary | ICD-10-CM

## 2021-06-15 LAB — GLUCOSE, RANDOM: Glucose, Bld: 131 mg/dL — ABNORMAL HIGH (ref 70–99)

## 2021-06-15 LAB — HEMOGLOBIN A1C: Hgb A1c MFr Bld: 6.8 % — ABNORMAL HIGH (ref 4.6–6.5)

## 2021-06-18 ENCOUNTER — Ambulatory Visit: Payer: Medicare HMO | Admitting: Endocrinology

## 2021-06-18 ENCOUNTER — Encounter: Payer: Self-pay | Admitting: Endocrinology

## 2021-06-18 VITALS — BP 144/78 | HR 60 | Ht 71.5 in | Wt 193.2 lb

## 2021-06-18 DIAGNOSIS — E1165 Type 2 diabetes mellitus with hyperglycemia: Secondary | ICD-10-CM | POA: Diagnosis not present

## 2021-06-18 NOTE — Patient Instructions (Signed)
Check blood sugars on waking up 3 days a week  Also check blood sugars about 2 hours after meals and do this after different meals by rotation  Recommended blood sugar levels on waking up are 90-120 and about 2 hours after meal is 130-160  Please bring your blood sugar monitor to each visit, thank you  

## 2021-06-18 NOTE — Progress Notes (Signed)
Patient ID: Steven Wheeler, male   DOB: January 13, 1951, 71 y.o.   MRN: [id number] ? ?       ? ? ?Reason for Appointment: Follow-up for Type 2 Diabetes ? ?Referring PCP: [facility] ? ? ?History of Present Illness:  ?        ?Date of diagnosis of type 2 diabetes mellitus:  2017     ? ?Background history:  ?He was asymptomatic at the time of diagnosis with A1c 6.9 ?He was started on metformin 500 mg twice daily which has been continued unchanged since then ?Has not been on any other medications ? ?Recent history:  ? ?A1c is 6.8 compared to 7.3, was 6.9 in October 22 ? ? ?Non-insulin hypoglycemic drugs the patient is taking are: Xigduo 06/998, 2 tablet daily ? ?Current management, blood sugar patterns and problems identified: ?He has been taking 2 tablets of the Xigduo since 1/23 ?Previously was taking 1 tablet a day and A1c had gone up  ?Although he has not lost weight his overall blood sugars are somewhat better despite inconsistent diet likely  ?Generally his fasting readings are better than before but still somewhat high ?Only once had a relatively high reading after meals which she thinks was from checking 1 hour after eating ?Previously since his blood sugar relatively better with continuing to have good lifestyle routine his Merleen Nicely was continued at the low-dose of 1 tablet daily  ?He is still able to continue his walking as below ?Diet: He says that because of previous celebrations and occasions he has not been consistent on his diet ?He is may not always be getting protein with his breakfast ? ?      ?Side effects from medications have been: None ?   ? ?Typical meal intake: Breakfast is cereal or oatmeal, bacon/eggs.  Usually fast food at dinnertime.  Snacks will be fruit, trail mix and popcorn              ? ?Exercise:  Walking 3 miles 6 days a week ? ?Glucose monitoring:  About once a day      Glucometer: Verio  ?   ?Blood Glucose readings by time of day and averages from meter download for the last 30  days: ? ? ?PRE-MEAL Fasting Lunch Evening Bedtime Overall  ?Glucose range: 108-155  122-167  108-244  ?Mean/median: 142    143  ? ?POST-MEAL PC Breakfast PC Lunch PC Dinner  ?Glucose range:  126-244   ?Mean/median:     ? ?Previously: ? ?PRE-MEAL Fasting Lunch Dinner Bedtime Overall  ?Glucose range: 147-171 119-195     ?Mean/median: 160 158 121  149  ? ?POST-MEAL PC Breakfast PC Lunch PC Dinner  ?Glucose range:     ?Mean/median:     ? ? ?Dietician visit, most recent: 2019 ? ?Weight history: ? ?Wt Readings from Last 3 Encounters:  ?06/18/21 193 lb 3.2 oz (87.6 kg)  ?03/24/21 188 lb (85.3 kg)  ?03/17/21 188 lb (85.3 kg)  ? ? ?Glycemic control: ?  ?Lab Results  ?Component Value Date  ? HGBA1C 6.8 (H) 06/15/2021  ? HGBA1C 7.3 (H) 03/11/2021  ? HGBA1C 6.9 (H) 12/03/2020  ? ?Lab Results  ?Component Value Date  ? MICROALBUR <0.2 01/06/2021  ? [place] 91 01/01/2021  ? CREATININE 1.00 03/11/2021  ? ?Lab Results  ?Component Value Date  ? MICRALBCREAT NOTE 01/06/2021  ? ? ?Lab Results  ?Component Value Date  ? FRUCTOSAMINE 316 (H) 07/14/2020  ? ? ?Lab on 06/15/2021  ?  Component Date Value Ref Range Status  ? Glucose, Bld 06/15/2021 131 (H)  70 - 99 mg/dL Final  ? Hgb A1c MFr Bld 06/15/2021 6.8 (H)  4.6 - 6.5 % Final  ? Glycemic Control Guidelines for People with Diabetes:Non Diabetic:  <6%Goal of Therapy: <7%Additional Action Suggested:  >8%   ? ? ?Allergies as of 06/18/2021   ?No Known Allergies ?  ? ?  ?Medication List  ?  ? ?  ? Accurate as of June 18, 2021  8:36 AM. If you have any questions, ask your nurse or doctor.  ?  ?  ? ?  ? ?ARGININE PO ?Take by mouth. ?  ?BERBERINE COMPLEX PO ?Take by mouth. ?  ?CINNAMON PO ?Take by mouth. ?  ?co-enzyme Q-10 50 MG capsule ?Take 50 mg by mouth daily. ?  ?KRILL OIL OMEGA-3 PO ?Take by mouth. ?  ?multivitamin tablet ?Take 1 tablet by mouth daily. ?  ?NON FORMULARY ?Tumeric, berberine? ?  ?NON FORMULARY ?1 capsule daily at 2 am. Cholestrol complete ?  ?OneTouch Delica Plus [id number]  Misc ?Use to check blood sugar once a day Dx code E11.9 ?  ?OneTouch Verio Flex System w/Device Kit ?Use to check blood sugar once daily  Dx code E11.9 ?  ?OneTouch Verio test strip ?Generic drug: glucose blood ?USE AS INSTRUCTED TO CHECK BLOOD SUGAR ONCE A DAY DX CODE E11.9 ?  ?rosuvastatin 20 MG tablet ?Commonly known as: Crestor ?Take 1 tablet (20 mg total) by mouth daily. ?  ?VITAMIN D PO ?Take by mouth. ?  ?Xigduo XR 06-998 MG Tb24 ?Generic drug: Dapagliflozin-metFORMIN HCl ER ?Take 2 tablets by mouth daily. ?  ? ?  ? ? ?Allergies: No Known Allergies ? ?Past Medical History:  ?Diagnosis Date  ? Diabetes mellitus without complication ([place])   ? Diverticulosis   ? 2009 Virtual colonoscopy  ? Hx of adenomatous polyp of colon 11/2020  ? 6 mm - no routine repeat colonoscopy needed  ? ? ?Past Surgical History:  ?Procedure Laterality Date  ? virtual colonoscopy  2009  ? tics  ? WISDOM TOOTH EXTRACTION    ? ? ?Family History  ?Problem Relation Age of Onset  ? Heart disease Father   ? Diabetes Father   ? Obesity Father   ? Breast cancer Sister   ? Cancer Sister   ? Kidney Stones Brother   ? Colon cancer Neg Hx   ? Colon polyps Neg Hx   ? Esophageal cancer Neg Hx   ? Stomach cancer Neg Hx   ? Rectal cancer Neg Hx   ? ? ?Social History:  reports that he has never smoked. He has never used smokeless tobacco. He reports current alcohol use. He reports that he does not use drugs. ? ? ?Review of Systems ? ?Lipid history: Has not had hypercholesterolemia.   ? ?Since he had a high calcium score he is now taking Crestor 20 mg daily since 02/10/2021 from his PCP ? ?Lab Results  ?Component Value Date  ? CHOL 152 01/01/2021  ? CHOL 111 04/15/2020  ? CHOL 130 11/12/2019  ? ?Lab Results  ?Component Value Date  ? HDL 47 01/01/2021  ? HDL 29 (L) 04/15/2020  ? HDL 42 11/12/2019  ? ?Lab Results  ?Component Value Date  ? [place] 91 01/01/2021  ? [place] 65 04/15/2020  ? [place] 69 11/12/2019  ? ?Lab Results  ?Component Value Date  ? TRIG  57 01/01/2021  ? TRIG 90 04/15/2020  ? TRIG  100 11/12/2019  ? ?Lab Results  ?Component Value Date  ? CHOLHDL 3.2 01/01/2021  ? CHOLHDL 3.8 04/15/2020  ? CHOLHDL 3.1 11/12/2019  ? ?No results found for: LDLDIRECT ?     ?    ?No history of hypertension: ? ?He feels like he has been told to have whitecoat syndrome ? ?BP Readings from Last 3 Encounters:  ?06/18/21 (!) 144/78  ?03/17/21 (!) 150/68  ?03/13/21 126/68  ? ? ?Most recent eye exam was in 2/22, normal ? ?Most recent foot exam: 4/22 ? ?Currently known complications of diabetes: None ? ?LABS: ? ?Lab on 06/15/2021  ?Component Date Value Ref Range Status  ? Glucose, Bld 06/15/2021 131 (H)  70 - 99 mg/dL Final  ? Hgb A1c MFr Bld 06/15/2021 6.8 (H)  4.6 - 6.5 % Final  ? Glycemic Control Guidelines for People with Diabetes:Non Diabetic:  <6%Goal of Therapy: <7%Additional Action Suggested:  >8%   ? ? ?Physical Examination: ? ?BP (!) 144/78   Pulse 60   Ht 5' 11.5" (1.816 m)   Wt 193 lb 3.2 oz (87.6 kg)   SpO2 97%   BMI 26.57 kg/m?  ? ? ?   ?ASSESSMENT: ? ?Diabetes type 2, non-insulin-dependent ? ?See history of present illness for detailed discussion of current diabetes management, blood sugar patterns and problems identified ? ?A1c is 6.8 compared to 7.3 ? ?Current treatment regimen is Xigduo 06/998, 2 tablets daily ? ?His blood sugars overall are better controlled although still has some inconsistent highs sugars fasting  ?Overall has not been able to watch his diet as well but is still walking for exercise ?Although he has been referred to the dietitian he feels like he can adjust his diet on his own ? ? ?PLAN:   ?Continue 2 tablets daily of the Xigduo, he can take both daily at the same time ? ?Check blood sugar readings after lunch or supper as well as fasting by rotation ? ?He will follow-up with PCP regarding lipids ? ? ? ?There are no Patient Instructions on file for this visit. ? ? ? ? ? ?Elayne Snare ?06/18/2021, 8:36 AM  ? ?Note: This office note was  prepared with Estate agent. Any transcriptional errors that result from this process are unintentional. ? ?

## 2021-06-30 ENCOUNTER — Other Ambulatory Visit: Payer: Self-pay | Admitting: Endocrinology

## 2021-07-08 ENCOUNTER — Other Ambulatory Visit: Payer: Self-pay | Admitting: Endocrinology

## 2021-08-01 ENCOUNTER — Other Ambulatory Visit: Payer: Self-pay | Admitting: Endocrinology

## 2021-09-08 ENCOUNTER — Encounter: Payer: Self-pay | Admitting: *Deleted

## 2021-09-08 DIAGNOSIS — Z006 Encounter for examination for normal comparison and control in clinical research program: Secondary | ICD-10-CM

## 2021-09-08 NOTE — Research (Signed)
I called patient to discuss the new research Wheeler Steven Wheeler. I left message for patient to call me back.

## 2021-10-09 ENCOUNTER — Other Ambulatory Visit (INDEPENDENT_AMBULATORY_CARE_PROVIDER_SITE_OTHER): Payer: Medicare HMO

## 2021-10-09 DIAGNOSIS — E1165 Type 2 diabetes mellitus with hyperglycemia: Secondary | ICD-10-CM | POA: Diagnosis not present

## 2021-10-09 LAB — BASIC METABOLIC PANEL
BUN: 22 mg/dL (ref 6–23)
CO2: 28 mEq/L (ref 19–32)
Calcium: 9.6 mg/dL (ref 8.4–10.5)
Chloride: 104 mEq/L (ref 96–112)
Creatinine, Ser: 0.97 mg/dL (ref 0.40–1.50)
GFR: 78.02 mL/min (ref >60.00)
Glucose, Bld: 109 mg/dL — ABNORMAL HIGH (ref 70–99)
Potassium: 4.9 mEq/L (ref 3.5–5.1)
Sodium: 139 mEq/L (ref 135–145)

## 2021-10-09 LAB — HEMOGLOBIN A1C: Hgb A1c MFr Bld: 7.1 % — ABNORMAL HIGH (ref 4.6–6.5)

## 2021-10-13 ENCOUNTER — Ambulatory Visit: Payer: Medicare HMO | Admitting: Endocrinology

## 2021-10-13 ENCOUNTER — Encounter: Payer: Self-pay | Admitting: Endocrinology

## 2021-10-13 VITALS — BP 122/70 | HR 65 | Ht 71.5 in | Wt 182.2 lb

## 2021-10-13 DIAGNOSIS — E1165 Type 2 diabetes mellitus with hyperglycemia: Secondary | ICD-10-CM

## 2021-10-13 NOTE — Progress Notes (Signed)
Patient ID: Steven Wheeler, male   DOB: [birth year], 71 y.o.   MRN: [id number]           Reason for Appointment: Follow-up for Type 2 Diabetes  Referring PCP: [facility]   History of Present Illness:          Date of diagnosis of type 2 diabetes mellitus:  2017      Background history:  He was asymptomatic at the time of diagnosis with A1c 6.9 He was started on metformin 500 mg twice daily which has been continued unchanged since then Has not been on any other medications  Recent history:   A1c is generally about the same and now 7.1   Non-insulin hypoglycemic drugs the patient is taking are: Xigduo 06/998, 2 tablet daily  Current management, blood sugar patterns and problems identified: He has been taking 2 tablets of the Xigduo since 1/23 No side effects with this He again says that his blood sugars are inconsistent based on his diet He has been eating out more, going to more celebrations and not always watching sweets  However his weight is down about 11 pounds since his last visit He thinks this is from being generally much more active overall and he also is trying to walk as before Fasting blood sugars are mildly increased as before although only 109 in the lab Only once had a relatively high reading after dinner of 189 otherwise blood sugars are generally averaging below 150 after meals, may be higher after lunch He has been advised to make sure he has some protein at each meal but has not checked blood sugars after breakfast        Side effects from medications have been: None     Typical meal intake: Breakfast is cereal or oatmeal, bacon/eggs.  Usually fast food at dinnertime.  Snacks will be fruit, trail mix and popcorn               Exercise:  Walking 3 miles 6 days a week  Glucose monitoring:  About once a day      Glucometer: Verio     Blood Glucose readings by time of day and averages from meter download for the last 30 days:   PRE-MEAL Fasting Lunch Dinner  Bedtime Overall  Glucose range: 121-151   103 103-189  Mean/median: 137  119  136   POST-MEAL PC Breakfast PC Lunch PC Dinner  Glucose range:  185-178   Mean/median:  147    PREVIOUSLY:   PRE-MEAL Fasting Lunch Evening Bedtime Overall  Glucose range: 108-155  122-167  108-244  Mean/median: 142    143   POST-MEAL PC Breakfast PC Lunch PC Dinner  Glucose range:  126-244   Mean/median:       Dietician visit, most recent: 2019  Weight history:  Wt Readings from Last 3 Encounters:  10/13/21 182 lb 3.2 oz (82.6 kg)  06/18/21 193 lb 3.2 oz (87.6 kg)  03/24/21 188 lb (85.3 kg)    Glycemic control:   Lab Results  Component Value Date   HGBA1C 7.1 (H) 10/09/2021   HGBA1C 6.8 (H) 06/15/2021   HGBA1C 7.3 (H) 03/11/2021   Lab Results  Component Value Date   MICROALBUR <0.2 01/06/2021   LDLCALC 91 01/01/2021   CREATININE 0.97 10/09/2021   Lab Results  Component Value Date   MICRALBCREAT NOTE 01/06/2021    Lab Results  Component Value Date   FRUCTOSAMINE 316 (H) 07/14/2020    Lab on  10/09/2021  Component Date Value Ref Range Status   Sodium 10/09/2021 139  135 - 145 mEq/L Final   Potassium 10/09/2021 4.9  3.5 - 5.1 mEq/L Final   Chloride 10/09/2021 104  96 - 112 mEq/L Final   CO2 10/09/2021 28  19 - 32 mEq/L Final   Glucose, Bld 10/09/2021 109 (H)  70 - 99 mg/dL Final   BUN 10/09/2021 22  6 - 23 mg/dL Final   Creatinine, Ser 10/09/2021 0.97  0.40 - 1.50 mg/dL Final   GFR 10/09/2021 78.02  >60.00 mL/min Final   Calculated using the CKD-EPI Creatinine Equation (2021)   Calcium 10/09/2021 9.6  8.4 - 10.5 mg/dL Final   Hgb A1c MFr Bld 10/09/2021 7.1 (H)  4.6 - 6.5 % Final   Glycemic Control Guidelines for People with Diabetes:Non Diabetic:  <6%Goal of Therapy: <7%Additional Action Suggested:  >8%     Allergies as of 10/13/2021   No Known Allergies      Medication List        Accurate as of October 13, 2021  8:56 AM. If you have any questions, ask your nurse  or doctor.          ARGININE PO Take by mouth.   BERBERINE COMPLEX PO Take by mouth.   CINNAMON PO Take by mouth.   co-enzyme Q-10 50 MG capsule Take 50 mg by mouth daily.   KRILL OIL OMEGA-3 PO Take by mouth.   multivitamin tablet Take 1 tablet by mouth daily.   NON FORMULARY Tumeric, berberine?   NON FORMULARY 1 capsule daily at 2 am. Cholestrol complete   OneTouch Delica Lancets 13K Misc USE TO CHECK BLOOD SUGAR ONCE A DAY DX CODE E11.9   OneTouch Verio Flex System w/Device Kit Use to check blood sugar once daily  Dx code E11.9   OneTouch Verio test strip Generic drug: glucose blood USE AS INSTRUCTED TO CHECK BLOOD SUGAR ONCE A DAY DX CODE E11.9   rosuvastatin 20 MG tablet Commonly known as: Crestor Take 1 tablet (20 mg total) by mouth daily.   VITAMIN D PO Take by mouth.   Xigduo XR 06-998 MG Tb24 Generic drug: Dapagliflozin-metFORMIN HCl ER TAKE TWO TABLETS BY MOUTH DAILY        Allergies: No Known Allergies  Past Medical History:  Diagnosis Date   Diabetes mellitus without complication ([place])    Diverticulosis    2009 Virtual colonoscopy   Hx of adenomatous polyp of colon 11/2020   6 mm - no routine repeat colonoscopy needed    Past Surgical History:  Procedure Laterality Date   virtual colonoscopy  2009   tics   WISDOM TOOTH EXTRACTION      Family History  Problem Relation Age of Onset   Heart disease Father    Diabetes Father    Obesity Father    Breast cancer Sister    Cancer Sister    Kidney Stones Brother    Colon cancer Neg Hx    Colon polyps Neg Hx    Esophageal cancer Neg Hx    Stomach cancer Neg Hx    Rectal cancer Neg Hx     Social History:  reports that he has never smoked. He has never used smokeless tobacco. He reports current alcohol use. He reports that he does not use drugs.   Review of Systems  Lipid history: Has not had hypercholesterolemia.    Since he had a high calcium score he is now taking  Crestor  20 mg daily since 02/10/2021 from his PCP  Lab Results  Component Value Date   CHOL 152 01/01/2021   CHOL 111 04/15/2020   CHOL 130 11/12/2019   Lab Results  Component Value Date   HDL 47 01/01/2021   HDL 29 (L) 04/15/2020   HDL 42 11/12/2019   Lab Results  Component Value Date   LDLCALC 91 01/01/2021   LDLCALC 65 04/15/2020   LDLCALC 69 11/12/2019   Lab Results  Component Value Date   TRIG 57 01/01/2021   TRIG 90 04/15/2020   TRIG 100 11/12/2019   Lab Results  Component Value Date   CHOLHDL 3.2 01/01/2021   CHOLHDL 3.8 04/15/2020   CHOLHDL 3.1 11/12/2019   No results found for: "LDLDIRECT"          No history of hypertension:  He feels like he has been told to have whitecoat syndrome  BP Readings from Last 3 Encounters:  10/13/21 122/70  06/18/21 (!) 144/78  03/17/21 (!) 150/68    Most recent eye exam was in 2/22, normal  Most recent foot exam: 4/22  Currently known complications of diabetes: None  LABS:  Lab on 10/09/2021  Component Date Value Ref Range Status   Sodium 10/09/2021 139  135 - 145 mEq/L Final   Potassium 10/09/2021 4.9  3.5 - 5.1 mEq/L Final   Chloride 10/09/2021 104  96 - 112 mEq/L Final   CO2 10/09/2021 28  19 - 32 mEq/L Final   Glucose, Bld 10/09/2021 109 (H)  70 - 99 mg/dL Final   BUN 30/74/1322 22  6 - 23 mg/dL Final   Creatinine, Ser 10/09/2021 0.97  0.40 - 1.50 mg/dL Final   GFR 52/91/6369 78.02  >60.00 mL/min Final   Calculated using the CKD-EPI Creatinine Equation (2021)   Calcium 10/09/2021 9.6  8.4 - 10.5 mg/dL Final   Hgb H8G MFr Bld 10/09/2021 7.1 (H)  4.6 - 6.5 % Final   Glycemic Control Guidelines for People with Diabetes:Non Diabetic:  <6%Goal of Therapy: <7%Additional Action Suggested:  >8%     Physical Examination:  BP 122/70   Pulse 65   Ht 5' 11.5" (1.816 m)   Wt 182 lb 3.2 oz (82.6 kg)   SpO2 97%   BMI 25.06 kg/m       ASSESSMENT:  Diabetes type 2, non-insulin-dependent  See history of  present illness for detailed discussion of current diabetes management, blood sugar patterns and problems identified  A1c is relatively higher at 7.1  Current treatment regimen is Xigduo 06/998, 2 tablets daily  His blood sugars overall are recently better controlled although tends to have relatively higher fasting readings  He has lost weight and is doing fairly well with physical activity level He does think that he has been off his diet periodically with eating out more and not watching what he is eating consistently   PLAN:   Continue 2 tablets daily of Xigduo as before He is thinks he can do better with diet as far as managing portions, carbohydrates and high fat meals and snacks or desserts Will again try to check blood sugars by rotation after different meals and also some in the morning and call if they start getting consistently higher  Follow-up with PCP regarding annual lipid and urine microalbumin screening  He can come back for follow-up in 5 months  There are no Patient Instructions on file for this visit.      Reather Littler 10/13/2021, 8:56 AM  Note: This office note was prepared with Dragon voice recognition system technology. Any transcriptional errors that result from this process are unintentional.  

## 2021-10-26 ENCOUNTER — Other Ambulatory Visit: Payer: Self-pay | Admitting: Endocrinology

## 2021-11-19 ENCOUNTER — Telehealth: Payer: Self-pay | Admitting: *Deleted

## 2021-11-19 DIAGNOSIS — Z006 Encounter for examination for normal comparison and control in clinical research program: Secondary | ICD-10-CM

## 2021-11-19 NOTE — Telephone Encounter (Signed)
I spoke to patient about the [employer]. I explained the study to the patient. Patient was not interested at this time. He does not care for injections.

## 2021-11-27 ENCOUNTER — Other Ambulatory Visit: Payer: Self-pay | Admitting: Endocrinology

## 2021-12-07 ENCOUNTER — Other Ambulatory Visit: Payer: Self-pay | Admitting: Endocrinology

## 2022-01-05 ENCOUNTER — Other Ambulatory Visit: Payer: Medicare HMO

## 2022-01-05 DIAGNOSIS — Z125 Encounter for screening for malignant neoplasm of prostate: Secondary | ICD-10-CM | POA: Diagnosis not present

## 2022-01-05 DIAGNOSIS — E119 Type 2 diabetes mellitus without complications: Secondary | ICD-10-CM | POA: Diagnosis not present

## 2022-01-05 DIAGNOSIS — E786 Lipoprotein deficiency: Secondary | ICD-10-CM | POA: Diagnosis not present

## 2022-01-05 DIAGNOSIS — E8881 Metabolic syndrome: Secondary | ICD-10-CM | POA: Diagnosis not present

## 2022-01-05 DIAGNOSIS — R030 Elevated blood-pressure reading, without diagnosis of hypertension: Secondary | ICD-10-CM | POA: Diagnosis not present

## 2022-01-05 LAB — COMPLETE METABOLIC PANEL WITH GFR
AG Ratio: 1.8 (calc) (ref 1.0–2.5)
ALT: 22 U/L (ref 9–46)
AST: 26 U/L (ref 10–35)
Albumin: 4.4 g/dL (ref 3.6–5.1)
Alkaline phosphatase (APISO): 52 U/L (ref 35–144)
BUN: 23 mg/dL (ref 7–25)
CO2: 28 mmol/L (ref 20–32)
Calcium: 9.6 mg/dL (ref 8.6–10.3)
Chloride: 103 mmol/L (ref 98–110)
Creat: 0.93 mg/dL (ref 0.70–1.28)
Globulin: 2.4 g/dL (calc) (ref 1.9–3.7)
Glucose, Bld: 114 mg/dL — ABNORMAL HIGH (ref 65–99)
Potassium: 5.2 mmol/L (ref 3.5–5.3)
Sodium: 138 mmol/L (ref 135–146)
Total Bilirubin: 0.6 mg/dL (ref 0.2–1.2)
Total Protein: 6.8 g/dL (ref 6.1–8.1)
eGFR: 87 mL/min/{1.73_m2} (ref >=60)

## 2022-01-05 LAB — LIPID PANEL
Cholesterol: 110 mg/dL (ref <200)
HDL: 51 mg/dL (ref >=40)
LDL Cholesterol (Calc): 46 mg/dL (calc)
Non-HDL Cholesterol (Calc): 59 mg/dL (calc) (ref <130)
Total CHOL/HDL Ratio: 2.2 (calc) (ref <5.0)
Triglycerides: 57 mg/dL (ref <150)

## 2022-01-05 LAB — CBC WITH DIFFERENTIAL/PLATELET
Absolute Monocytes: 635 cells/uL (ref 200–950)
Basophils Absolute: 69 cells/uL (ref 0–200)
Basophils Relative: 1 %
Eosinophils Absolute: 214 cells/uL (ref 15–500)
Eosinophils Relative: 3.1 %
HCT: 46.5 % (ref 38.5–50.0)
Hemoglobin: 15.4 g/dL (ref 13.2–17.1)
Lymphs Abs: 2105 cells/uL (ref 850–3900)
MCH: 29.3 pg (ref 27.0–33.0)
MCHC: 33.1 g/dL (ref 32.0–36.0)
MCV: 88.4 fL (ref 80.0–100.0)
MPV: 10 fL (ref 7.5–12.5)
Monocytes Relative: 9.2 %
Neutro Abs: 3878 cells/uL (ref 1500–7800)
Neutrophils Relative %: 56.2 %
Platelets: 207 10*3/uL (ref 140–400)
RBC: 5.26 10*6/uL (ref 4.20–5.80)
RDW: 11.6 % (ref 11.0–15.0)
Total Lymphocyte: 30.5 %
WBC: 6.9 10*3/uL (ref 3.8–10.8)

## 2022-01-05 LAB — HEMOGLOBIN A1C
Hgb A1c MFr Bld: 6.8 % of total Hgb — ABNORMAL HIGH (ref <5.7)
Mean Plasma Glucose: 148 mg/dL
eAG (mmol/L): 8.2 mmol/L

## 2022-01-05 LAB — PSA: PSA: 0.49 ng/mL (ref <=4.00)

## 2022-01-07 ENCOUNTER — Ambulatory Visit (INDEPENDENT_AMBULATORY_CARE_PROVIDER_SITE_OTHER): Payer: Medicare HMO | Admitting: Internal Medicine

## 2022-01-07 ENCOUNTER — Encounter: Payer: Self-pay | Admitting: Internal Medicine

## 2022-01-07 ENCOUNTER — Telehealth: Payer: Self-pay | Admitting: Internal Medicine

## 2022-01-07 VITALS — BP 134/74 | HR 72 | Temp 98.6°F | Ht 71.0 in | Wt 186.4 lb

## 2022-01-07 DIAGNOSIS — Z0000 Encounter for general adult medical examination without abnormal findings: Secondary | ICD-10-CM

## 2022-01-07 DIAGNOSIS — Z8249 Family history of ischemic heart disease and other diseases of the circulatory system: Secondary | ICD-10-CM

## 2022-01-07 DIAGNOSIS — E119 Type 2 diabetes mellitus without complications: Secondary | ICD-10-CM | POA: Diagnosis not present

## 2022-01-07 DIAGNOSIS — E786 Lipoprotein deficiency: Secondary | ICD-10-CM

## 2022-01-07 DIAGNOSIS — R931 Abnormal findings on diagnostic imaging of heart and coronary circulation: Secondary | ICD-10-CM

## 2022-01-07 DIAGNOSIS — Z23 Encounter for immunization: Secondary | ICD-10-CM

## 2022-01-07 DIAGNOSIS — Z7185 Encounter for immunization safety counseling: Secondary | ICD-10-CM | POA: Diagnosis not present

## 2022-01-07 LAB — POCT URINALYSIS DIPSTICK
Bilirubin, UA: NEGATIVE
Blood, UA: NEGATIVE
Glucose, UA: NEGATIVE
Ketones, UA: NEGATIVE
Leukocytes, UA: NEGATIVE
Nitrite, UA: NEGATIVE
Protein, UA: NEGATIVE
Spec Grav, UA: 1.015 (ref 1.010–1.025)
Urobilinogen, UA: 0.2 E.U./dL
pH, UA: 5 (ref 5.0–8.0)

## 2022-01-07 NOTE — Telephone Encounter (Signed)
Steven Wheeler showed me where he got his Licensed conveyancer at [employer] on 11/30/2021. Will you please put in his chart?

## 2022-01-07 NOTE — Progress Notes (Signed)
Annual Wellness Visit     Patient: Steven Wheeler, Male    DOB: 07/03/1950, 71 y.o.   MRN: [id number] Visit Date: 01/07/2022  Chief Complaint  Patient presents with   Annual Exam   Subjective    Steven Wheeler is a 71 y.o. male who presents today for his Annual Wellness Visit.  HPI He also present for health maintenance exam and evaluation of medical issues.  His general health is good.  He has a history of type 2 diabetes mellitus and is seen by Dr. Dwyane Dee.  His wife passed away of complications of breast cancer in 2020.  Over the past couple of years his glucose control has not been quite as good as I would like to see it.  Dr. Dwyane Dee initially saw him in April 2022.  Currently on Xigduo and he would like an A1c has  improved to 6.8% he also takes statin medication.  Fasting glucose is 114.  Kidney functions and liver panel are normal.  PSA is normal.  Past medical history: Wisdom teeth extraction at age 51.  Hepatitis A around 1982.  History of fractured left arm at age 42.  Up until recently did not want to be on statin medication.  History of borderline hypertension and metabolic syndrome as well as hypertriglyceridemia.  Social history: Daughter has a grandchild and he spends time with them.  He has 2 adult daughters total.  He is a retired Water engineer.  Non-smoker.  Social alcohol consumption.  Family history: Mother died of an MI.  Mother passed away of complications of septicemia in her late 58s with history of dementia, congestive heart failure and vision loss.  1 brother in good health.  3 sisters-1 died of cancer.  2 sisters living in one of them has hypertension and claudication symptoms.  Dr. Carlean Purl is indicated to him that colonoscopy done in 2022 was stable and does not need to be repeated  Patient Care Team: Elby Showers, MD as PCP - General (Internal Medicine) Josue Hector, MD as PCP - Cardiology (Cardiology)  Review of Systems no new  complaints-feels well.  His family is close and he stays busy doing activities with his family.   Objective    Vitals: BP 134/74   Pulse 72   Temp 98.6 F (37 C) (Tympanic)   Ht '5\' 11"'$  (1.803 m)   Wt 186 lb 6.4 oz (84.6 kg)   SpO2 98%   BMI 26.00 kg/m   Physical Exam  Skin: Warm and dry.  Nodes: None.  No thyromegaly or carotid bruits.  Chest clear.  Cardiac exam: Regular rate and rhythm without murmur or ectopy.  Abdomen is soft, nondistended, without hepatosplenomegaly, masses, or tenderness.  Brief neurological exam is intact without gross focal deficits.  His affect, thought, and judgment are within normal limits.  He has no lower extremity edema or deformity.  Prostate is normal without masses.   Most recent functional status assessment:    01/07/2022    3:03 PM  In your present state of health, do you have any difficulty performing the following activities:  Hearing? 0  Vision? 0  Difficulty concentrating or making decisions? 0  Walking or climbing stairs? 0  Dressing or bathing? 0  Doing errands, shopping? 0  Preparing Food and eating ? N  Using the Toilet? N  In the past six months, have you accidently leaked urine? N  Do you have problems with loss of bowel control?  N  Managing your Medications? N  Managing your Finances? N  Housekeeping or managing your Housekeeping? N   Most recent fall risk assessment:    01/07/2022    3:02 PM  [place] in the past year? 0  Number falls in past yr: 0  Injury with Fall? 0  Risk for fall due to : No Fall Risks  Follow up Falls evaluation completed    Most recent depression screenings:    01/07/2022    3:02 PM 01/06/2021    2:19 PM  PHQ 2/9 Scores  PHQ - 2 Score 0 0   Most recent cognitive screening:    01/07/2022    3:04 PM  6CIT Screen  What Year? 0 points  What month? 0 points  What time? 0 points  Count back from 20 0 points  Months in reverse 0 points  Repeat phrase 0 points  Total Score 0  points       Assessment & Plan   Type 2 diabetes mellitus-stable with Dr. Ronnie Derby assistance.  Family history of heart disease.  Patient had CT cardiac scoring and had a score of 2516.  He will take Crestor 20 mg daily  Low HDL cholesterol  Colonoscopy was done and not to be repeated per Dr. Carlean Purl  Tetanus immunization needed in 2024.  Other vaccines reviewed and recommendations made.  Plan: Return       Annual wellness visit done today including the all of the following: Reviewed patient's Family Medical History Reviewed and updated list of patient's medical providers Assessment of cognitive impairment was done Assessed patient's functional ability Established a written schedule for health screening [place] Completed and Reviewed  Discussed health benefits of physical activity, and encouraged him to engage in regular exercise appropriate for his age and condition.     IElby Showers, MD, have reviewed all documentation for this visit. The documentation on 02/20/22 for the exam, diagnosis, procedures, and orders are all accurate and complete.    {I, Elby Showers, MD, have reviewed all documentation for this visit. The documentation on 02/20/22 for the exam, diagnosis, procedures, and orders are all accurate and complete.   LaVon Barron Alvine, CMA

## 2022-01-08 LAB — MICROALBUMIN / CREATININE URINE RATIO
Creatinine, Urine: 73 mg/dL (ref 20–320)
Microalb, Ur: <0.2 mg/dL

## 2022-01-09 ENCOUNTER — Other Ambulatory Visit: Payer: Self-pay | Admitting: Endocrinology

## 2022-01-29 ENCOUNTER — Other Ambulatory Visit: Payer: Self-pay | Admitting: Internal Medicine

## 2022-02-09 ENCOUNTER — Other Ambulatory Visit: Payer: Self-pay | Admitting: Endocrinology

## 2022-02-20 NOTE — Patient Instructions (Addendum)
It was a pleasure to see you today.  Please take statin medication due to high coronary calcium score and family history.  Continue to keep diabetes under good control with Dr. Ronnie Derby assistance.  Return in 1 year or as needed.  Vaccines discussed.  Colonoscopy does not need to be repeated per Dr. Carlean Purl.  Pneumococcal 20 vaccine given today.

## 2022-03-10 ENCOUNTER — Other Ambulatory Visit: Payer: Self-pay | Admitting: Endocrinology

## 2022-03-16 ENCOUNTER — Other Ambulatory Visit (INDEPENDENT_AMBULATORY_CARE_PROVIDER_SITE_OTHER): Payer: Medicare HMO

## 2022-03-16 ENCOUNTER — Other Ambulatory Visit: Payer: Self-pay | Admitting: Endocrinology

## 2022-03-16 DIAGNOSIS — E1165 Type 2 diabetes mellitus with hyperglycemia: Secondary | ICD-10-CM | POA: Diagnosis not present

## 2022-03-16 LAB — BASIC METABOLIC PANEL
BUN: 22 mg/dL (ref 6–23)
CO2: 27 mEq/L (ref 19–32)
Calcium: 9.7 mg/dL (ref 8.4–10.5)
Chloride: 102 mEq/L (ref 96–112)
Creatinine, Ser: 0.93 mg/dL (ref 0.40–1.50)
GFR: 81.81 mL/min (ref >60.00)
Glucose, Bld: 139 mg/dL — ABNORMAL HIGH (ref 70–99)
Potassium: 4.9 mEq/L (ref 3.5–5.1)
Sodium: 138 mEq/L (ref 135–145)

## 2022-03-17 LAB — FRUCTOSAMINE: Fructosamine: 259 umol/L (ref 0–285)

## 2022-03-18 ENCOUNTER — Encounter: Payer: Self-pay | Admitting: Endocrinology

## 2022-03-18 ENCOUNTER — Ambulatory Visit: Payer: Medicare HMO | Admitting: Endocrinology

## 2022-03-18 VITALS — BP 136/80 | HR 66 | Ht 71.0 in | Wt 189.8 lb

## 2022-03-18 DIAGNOSIS — E1165 Type 2 diabetes mellitus with hyperglycemia: Secondary | ICD-10-CM

## 2022-03-18 NOTE — Progress Notes (Signed)
Patient ID: Steven Wheeler, male   DOB: 1950-03-21, 72 y.o.   MRN: [id number]           Reason for Appointment: Follow-up for Type 2 Diabetes  Referring PCP: [facility]   History of Present Illness:          Date of diagnosis of type 2 diabetes mellitus:  2017      Background history:  He was asymptomatic at the time of diagnosis with A1c 6.9 He was started on metformin 500 mg twice daily which has been continued unchanged since then Has not been on any other medications  Recent history:   A1c is generally about the same and last 6.8 Fructosamine 259  Non-insulin hypoglycemic drugs the patient is taking are: Xigduo 06/998, 2 tablet daily  Current management, blood sugar patterns and problems identified: He has been taking 2 tablets of the Xigduo since 1/23 However in December because of the high cost he was only taking 1 a day for for couple of weeks at least He says his main meal is at lunch but does not appear to have high readings in the afternoon when checked Overall has gained weight although previously had lost a significant amount of weight and likely is getting more calories especially recently He is very consistent with his walking daily As before he may tend to have some high fasting readings Also he thinks that when he checks his blood sugar after his walking it may be increased at about 160 at times         Side effects from medications have been: None    Typical meal intake: Breakfast is cereal or oatmeal, bacon/eggs.  Usually fast food at dinnertime.  Snacks will be fruit, trail mix and popcorn               Exercise:  Walking 3 miles 6 days a week  Glucose monitoring:  About once a day      Glucometer: Verio     Blood Glucose readings by time of day and averages from meter review, could not be downloaded   PRE-MEAL Fasting Lunch Dinner Bedtime Overall  Glucose range: 106-138      Mean/median:        POST-MEAL PC Breakfast PC Lunch PC Dinner  Glucose  range: 153 118 ?  Mean/median:      Previously:  PRE-MEAL Fasting Lunch Dinner Bedtime Overall  Glucose range: 121-151   103 103-189  Mean/median: 137  119  136   POST-MEAL PC Breakfast PC Lunch PC Dinner  Glucose range:  185-178   Mean/median:  147      Dietician visit, most recent: 2019  Weight history:  Wt Readings from Last 3 Encounters:  03/18/22 189 lb 12.8 oz (86.1 kg)  01/07/22 186 lb 6.4 oz (84.6 kg)  10/13/21 182 lb 3.2 oz (82.6 kg)    Glycemic control:   Lab Results  Component Value Date   HGBA1C 6.8 (H) 01/05/2022   HGBA1C 7.1 (H) 10/09/2021   HGBA1C 6.8 (H) 06/15/2021   Lab Results  Component Value Date   MICROALBUR <0.2 01/07/2022   LDLCALC 46 01/05/2022   CREATININE 0.93 03/16/2022   Lab Results  Component Value Date   MICRALBCREAT NOTE 01/07/2022    Lab Results  Component Value Date   FRUCTOSAMINE 259 03/16/2022   FRUCTOSAMINE 316 (H) 07/14/2020    Lab on 03/16/2022  Component Date Value Ref Range Status   Sodium 03/16/2022 138  135 -  145 mEq/L Final   Potassium 03/16/2022 4.9  3.5 - 5.1 mEq/L Final   Chloride 03/16/2022 102  96 - 112 mEq/L Final   CO2 03/16/2022 27  19 - 32 mEq/L Final   Glucose, Bld 03/16/2022 139 (H)  70 - 99 mg/dL Final   BUN 03/16/2022 22  6 - 23 mg/dL Final   Creatinine, Ser 03/16/2022 0.93  0.40 - 1.50 mg/dL Final   GFR 03/16/2022 81.81  >60.00 mL/min Final   Calculated using the CKD-EPI Creatinine Equation (2021)   Calcium 03/16/2022 9.7  8.4 - 10.5 mg/dL Final   Fructosamine 03/16/2022 259  0 - 285 umol/L Final   Comment: Published reference interval for apparently healthy subjects between age 61 and 47 is 58 - 285 umol/L and in a poorly controlled diabetic population is 228 - 563 umol/L with a mean of 396 umol/L.     Allergies as of 03/18/2022   No Known Allergies      Medication List        Accurate as of March 18, 2022  4:18 PM. If you have any questions, ask your nurse or doctor.           ARGININE PO Take by mouth.   BERBERINE COMPLEX PO Take by mouth.   CINNAMON PO Take by mouth.   co-enzyme Q-10 50 MG capsule Take 50 mg by mouth daily.   KRILL OIL OMEGA-3 PO Take by mouth.   multivitamin tablet Take 1 tablet by mouth daily.   NON FORMULARY Tumeric, berberine?   NON FORMULARY 1 capsule daily at 2 am. Cholestrol complete   OneTouch Delica Lancets 08M Misc USE TO CHECK BLOOD SUGAR ONCE A DAY DX CODE E11.9   OneTouch Verio Flex System w/Device Kit Use to check blood sugar once daily  Dx code E11.9   OneTouch Verio test strip Generic drug: glucose blood USE AS INSTRUCTED TO CHECK BLOOD SUGAR ONCE A DAY DX CODE E11.9   rosuvastatin 20 MG tablet Commonly known as: CRESTOR TAKE ONE TABLET BY MOUTH ONE TIME DAILY   VITAMIN D PO Take by mouth.   Xigduo XR 06-998 MG Tb24 Generic drug: Dapagliflozin Pro-metFORMIN ER TAKE TWO TABLETS BY MOUTH DAILY        Allergies: No Known Allergies  Past Medical History:  Diagnosis Date   Diabetes mellitus without complication ([place])    Diverticulosis    2009 Virtual colonoscopy   Hx of adenomatous polyp of colon 11/2020   6 mm - no routine repeat colonoscopy needed    Past Surgical History:  Procedure Laterality Date   virtual colonoscopy  2009   tics   WISDOM TOOTH EXTRACTION      Family History  Problem Relation Age of Onset   Heart disease Father    Diabetes Father    Obesity Father    Breast cancer Sister    Cancer Sister    Kidney Stones Brother    Colon cancer Neg Hx    Colon polyps Neg Hx    Esophageal cancer Neg Hx    Stomach cancer Neg Hx    Rectal cancer Neg Hx     Social History:  reports that he has never smoked. He has never used smokeless tobacco. He reports current alcohol use. He reports that he does not use drugs.   Review of Systems  Lipid history: Has not had hypercholesterolemia.    Since he had a high calcium score he is now taking Crestor 20 mg daily  since  02/10/2021 from his PCP  Lab Results  Component Value Date   CHOL 110 01/05/2022   CHOL 152 01/01/2021   CHOL 111 04/15/2020   Lab Results  Component Value Date   HDL 51 01/05/2022   HDL 47 01/01/2021   HDL 29 (L) 04/15/2020   Lab Results  Component Value Date   LDLCALC 46 01/05/2022   LDLCALC 91 01/01/2021   LDLCALC 65 04/15/2020   Lab Results  Component Value Date   TRIG 57 01/05/2022   TRIG 57 01/01/2021   TRIG 90 04/15/2020   Lab Results  Component Value Date   CHOLHDL 2.2 01/05/2022   CHOLHDL 3.2 01/01/2021   CHOLHDL 3.8 04/15/2020   No results found for: "LDLDIRECT"          No history of hypertension:  He feels like he has been told to have whitecoat syndrome  BP Readings from Last 3 Encounters:  03/18/22 136/80  01/07/22 134/74  10/13/21 122/70    Most recent eye exam was in 2/22, normal  Most recent foot exam: 4/22  Currently known complications of diabetes: None  LABS:  Lab on 03/16/2022  Component Date Value Ref Range Status   Sodium 03/16/2022 138  135 - 145 mEq/L Final   Potassium 03/16/2022 4.9  3.5 - 5.1 mEq/L Final   Chloride 03/16/2022 102  96 - 112 mEq/L Final   CO2 03/16/2022 27  19 - 32 mEq/L Final   Glucose, Bld 03/16/2022 139 (H)  70 - 99 mg/dL Final   BUN 03/16/2022 22  6 - 23 mg/dL Final   Creatinine, Ser 03/16/2022 0.93  0.40 - 1.50 mg/dL Final   GFR 03/16/2022 81.81  >60.00 mL/min Final   Calculated using the CKD-EPI Creatinine Equation (2021)   Calcium 03/16/2022 9.7  8.4 - 10.5 mg/dL Final   Fructosamine 03/16/2022 259  0 - 285 umol/L Final   Comment: Published reference interval for apparently healthy subjects between age 39 and 64 is 22 - 285 umol/L and in a poorly controlled diabetic population is 228 - 563 umol/L with a mean of 396 umol/L.     Physical Examination:  BP 136/80 (BP Location: Left Arm, Patient Position: Sitting, Cuff Size: Normal)   Pulse 66   Ht '5\' 11"'$  (1.803 m)   Wt 189 lb 12.8 oz (86.1 kg)    SpO2 97%   BMI 26.47 kg/m       ASSESSMENT:  Diabetes type 2, non-insulin-dependent  See history of present illness for detailed discussion of current diabetes management, blood sugar patterns and problems identified  A1c is recently better at 6.8, previously was at 7.1  Current treatment regimen is Xigduo 06/998, 2 tablets daily  His blood sugars overall are generally fairly well-controlled Not taking enough blood sugars overall and very few after meals Could not download meter today  May have dawn phenomenon and this was explained  Although he has gained weight his BMI is still relatively okay at 33.5 Likely has had increased caloric intake over the last couple of months   PLAN:   Continue 2 tablets daily of Xigduo and take this consistently More frequent blood sugar monitoring after meals with 2-hour target about 160 or less Avoid any further weight gain He can come back for follow-up in 5 months  There are no Patient Instructions on file for this visit.      Elayne Snare 03/18/2022, 4:18 PM   Note: This office note was prepared with Dragon voice  recognition system technology. Any transcriptional errors that result from this process are unintentional.

## 2022-04-16 DIAGNOSIS — E119 Type 2 diabetes mellitus without complications: Secondary | ICD-10-CM | POA: Diagnosis not present

## 2022-04-16 DIAGNOSIS — H524 Presbyopia: Secondary | ICD-10-CM | POA: Diagnosis not present

## 2022-04-16 LAB — HM DIABETES EYE EXAM

## 2022-04-28 ENCOUNTER — Other Ambulatory Visit: Payer: Self-pay | Admitting: Internal Medicine

## 2022-05-07 IMAGING — CT CT CARDIAC CORONARY ARTERY CALCIUM SCORE
2 of 4 series · 6 of 20 positions shown, 7 images · non-contrast
Comparison: None.
COMPARISON: None.

Addendum:
EXAM:
OVER-READ INTERPRETATION  CT CHEST

The following report is an over-read performed by radiologist Dr.
Louerdes Roure [REDACTED] on 01/28/2021. This
over-read does not include interpretation of cardiac or coronary
anatomy or pathology. The coronary calcium score interpretation by
the cardiologist is attached.
CLINICAL DATA: Cardiovascular Disease Risk stratification
Coronary Calcium Score
TECHNIQUE: A gated, non-contrast computed tomography scan of the heart was
performed using 3mm slice thickness. Axial images were analyzed on a
dedicated workstation. Calcium scoring of the coronary arteries was
performed using the Agatston method.

[Series 3: 2 soft full fov · axial · 0.74mm/px · z∈[-366,-276]mm · 3 of 62 slices shown, 4 images]
[im 16/62  vessel]
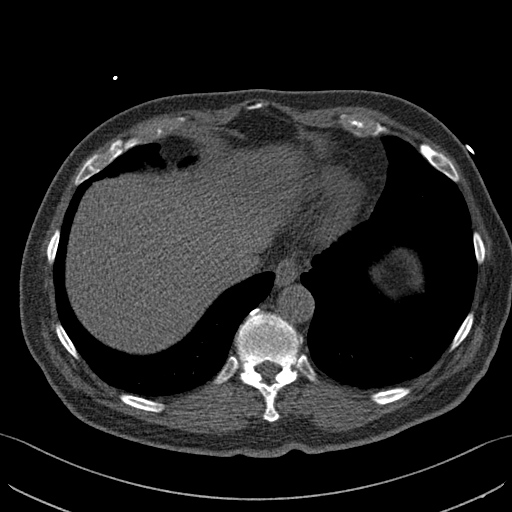
[im 16/62  lung]
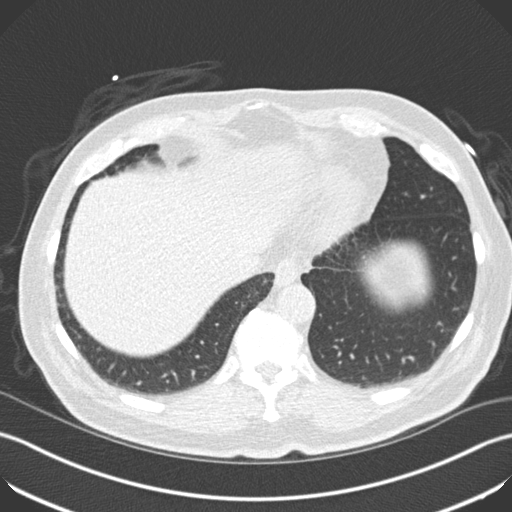
[im 31/62  vessel]
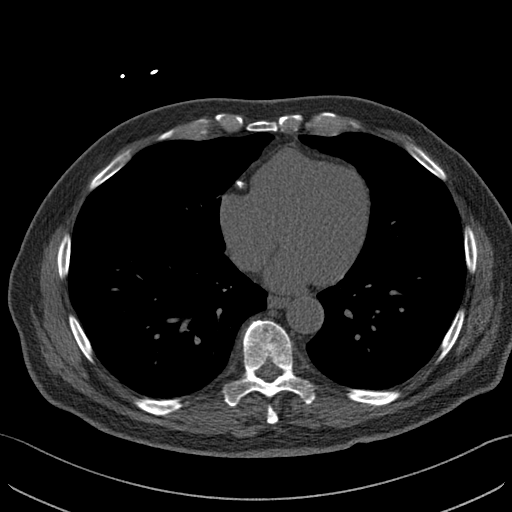
[im 46/62  vessel]
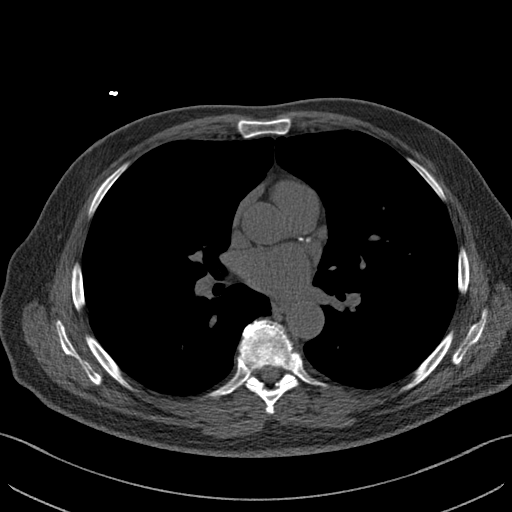

[Series 5: lungs · axial · 0.74mm/px · z∈[-366,-276]mm · 3 of 62 slices shown]
[im 16/62  vessel]
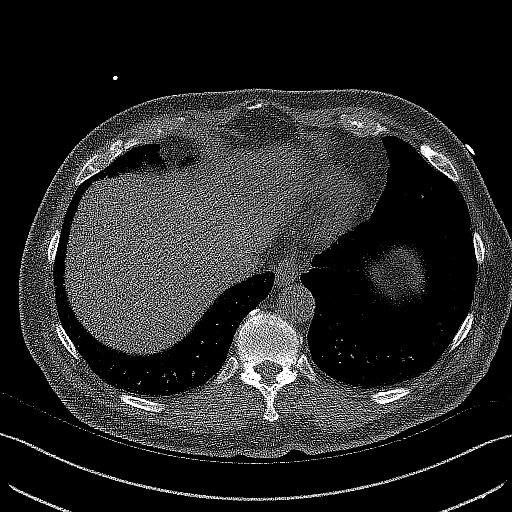
[im 31/62  vessel]
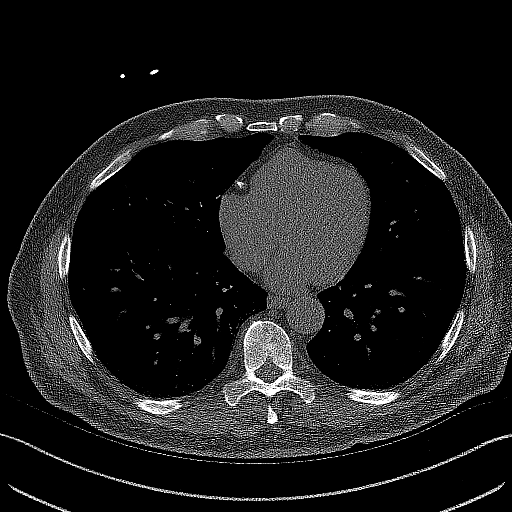
[im 46/62  vessel]
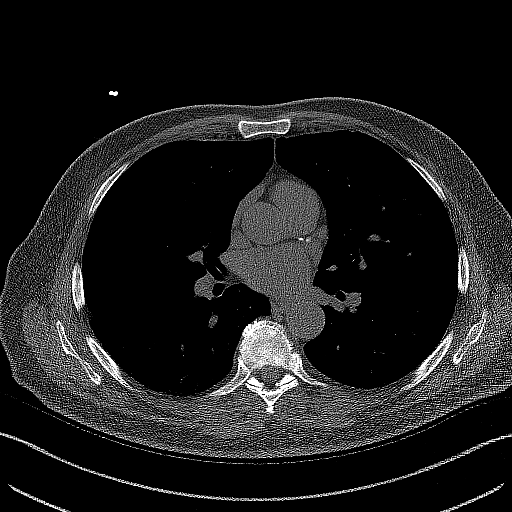

[6 of 20 positions shown; findings below may reference images not displayed]

FINDINGS: Within the visualized portions of the thorax there are no suspicious
appearing pulmonary nodules or masses, there is no acute
consolidative airspace disease, no pleural effusions, no
pneumothorax and no lymphadenopathy. Visualized portions of the
upper abdomen are unremarkable. There are no aggressive appearing
lytic or blastic lesions noted in the visualized portions of the
skeleton.
IMPRESSION: 1. No significant incidental noncardiac findings are noted.
FINDINGS: Coronary arteries: Normal origins.

Coronary Calcium Score:

Left main: 74

Left anterior descending artery: 867

Left circumflex artery: 3

Right coronary artery: 8729

Total: 7550

Percentile: 95th

Pericardium: Normal.

Aorta: Normal caliber of ascending aorta. No significant aortic
atherosclerosis noted.

Non-cardiac: See separate report from [REDACTED].
IMPRESSION: Coronary calcium score of 7550. This was 95th percentile for age-,
race-, and sex-matched controls.



If CAC=0, it is reasonable to withhold statin therapy and reassess
in 5 to 10 years, as long as higher risk conditions are absent
(diabetes mellitus, family history of premature CHD in first degree
relatives (males <55 years; females <65 years), cigarette smoking,
or LDL >=190 mg/dL).

If CAC is 1 to 99, it is reasonable to initiate statin therapy for
patients >=55 years of age.

If CAC is >=100 or >=75th percentile, it is reasonable to initiate
statin therapy at any age.

Cardiology referral should be considered for patients with CAC
scores >=400 or >=75th percentile.

*0135 AHA/ACC/AACVPR/AAPA/ABC/GUDIEL/IDISIS/ZEINAB/Itz Juss/GOUGH/NININHA/JORDAN ALEXANDER
Guideline on the Management of Blood Cholesterol: A Report of the
American College of Cardiology/American Heart Association Task Force
on Clinical Practice Guidelines. J Am Coll Cardiol.
1648;73(24):2720-2682.

*** End of Addendum ***
EXAM:
OVER-READ INTERPRETATION  CT CHEST

The following report is an over-read performed by radiologist Dr.
Louerdes Roure [REDACTED] on 01/28/2021. This
over-read does not include interpretation of cardiac or coronary
anatomy or pathology. The coronary calcium score interpretation by
the cardiologist is attached.
FINDINGS: Within the visualized portions of the thorax there are no suspicious
appearing pulmonary nodules or masses, there is no acute
consolidative airspace disease, no pleural effusions, no
pneumothorax and no lymphadenopathy. Visualized portions of the
upper abdomen are unremarkable. There are no aggressive appearing
lytic or blastic lesions noted in the visualized portions of the
skeleton.
IMPRESSION: 1. No significant incidental noncardiac findings are noted.

## 2022-05-12 ENCOUNTER — Other Ambulatory Visit: Payer: Self-pay | Admitting: Endocrinology

## 2022-05-12 NOTE — Progress Notes (Signed)
CARDIOLOGY CONSULT NOTE       Patient ID: Steven Wheeler MRN: [id number] DOB/AGE: April 13, 1950 72 y.o.  Admit date: (Not on file) Referring Physician: [facility] Primary Physician: Elby Showers, MD Primary Cardiologist: Johnsie Cancel Reason for Consultation: CAD    HPI:  72 y.o. with history of DM and family history of premature CAD with father dying of an MI.. No history of vascular/cardiac disease Had coronary calcium score done 01/28/21 Reviewed Severe LM and 3 vessel calcium total score 2516 which was 95 th percentile for age/sex. Historically his A1c has been poorly controlled ranging from 8.9-> current 7.3.  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 ( [employer]) and also like bourbon and self defence things   ROS All other systems reviewed and negative except as noted above  Past Medical History:  Diagnosis Date   Diabetes mellitus without complication ([facility])    Diverticulosis    2009 Virtual colonoscopy   Hx of adenomatous polyp of colon 11/2020   6 mm - no routine repeat colonoscopy needed    Family History  Problem Relation Age of Onset   Heart disease Father    Diabetes Father    Obesity Father    Breast cancer Sister    Cancer Sister    Kidney Stones Brother    Colon cancer Neg Hx    Colon polyps Neg Hx    Esophageal cancer Neg Hx    Stomach cancer Neg Hx    Rectal cancer Neg Hx     Social History   Socioeconomic History   Marital status: Widowed    Spouse name: Not on file    Number of children: Not on file   Years of education: Not on file   Highest education level: Not on file  Occupational History   Not on file  Tobacco Use   Smoking status: Never   Smokeless tobacco: Never  Vaping Use   Vaping Use: Never used  Substance and Sexual Activity   Alcohol use: Yes    Comment: rare once a month maybe   Drug use: No   Sexual activity: Not on file  Other Topics Concern   Not on file  Social History Narrative   Not on file   Social Determinants of Health   Financial Resource Strain: Not on file  Food Insecurity: Not on file  Transportation Needs: Not on file  Physical Activity: Not on file  Stress: Not on file  Social Connections: Not on file  Intimate Partner Violence: Not on file    Past Surgical History:  Procedure Laterality Date   virtual colonoscopy  2009   tics   WISDOM TOOTH EXTRACTION        Current Outpatient Medications:    ARGININE PO, Take by mouth., Disp: , Rfl:    Barberry-Oreg Grape-Goldenseal (BERBERINE COMPLEX PO), Take by mouth., Disp: , Rfl:    Blood Glucose Monitoring Suppl (Blain)  w/Device KIT, Use to check blood sugar once daily  Dx code E11.9, Disp: 1 kit, Rfl: 0   Cholecalciferol (VITAMIN D PO), Take by mouth., Disp: , Rfl:    CINNAMON PO, Take by mouth., Disp: , Rfl:    co-enzyme Q-10 50 MG capsule, Take 50 mg by mouth daily., Disp: , Rfl:    Dapagliflozin Pro-metFORMIN ER (XIGDUO XR) 06-998 MG TB24, TAKE TWO TABLETS BY MOUTH DAILY, Disp: 60 tablet, Rfl: 3   KRILL OIL OMEGA-3 PO, Take by mouth., Disp: , Rfl:    Multiple Vitamin (MULTIVITAMIN) tablet, Take 1 tablet by mouth daily., Disp: , Rfl:    NON FORMULARY, Tumeric, berberine?, Disp: , Rfl:    NON FORMULARY, 1 capsule daily at 2 am. Cholestrol complete, Disp: , Rfl:    OneTouch Delica Lancets 99991111 MISC, USE TO CHECK BLOOD SUGAR ONCE A DAY DX CODE E11.9, Disp: 100 each, Rfl: 3   ONETOUCH VERIO test strip, USE AS INSTRUCTED TO CHECK BLOOD  SUGAR ONCE A DAY DX CODE E11.9, Disp: 50 strip, Rfl: 2   rosuvastatin (CRESTOR) 20 MG tablet, TAKE ONE TABLET BY MOUTH ONE TIME DAILY, Disp: 90 tablet, Rfl: 0 No current facility-administered medications for this visit.  Facility-Administered Medications Ordered in Other Visits:    technetium tetrofosmin (TC-MYOVIEW) injection [id number] millicurie, A999333 millicurie, Intravenous, Once PRN, Croitoru, Mihai, MD    Physical Exam: Blood pressure 118/70, pulse 66, height 6' (1.829 m), weight 192 lb (87.1 kg), SpO2 98 %.    Affect appropriate Healthy:  appears stated age 54: normal Neck supple with no adenopathy JVP normal no bruits no thyromegaly Lungs clear with no wheezing and good diaphragmatic motion Heart:  S1/S2 no murmur, no rub, gallop or click PMI normal Abdomen: benighn, BS positve, no tenderness, no AAA no bruit.  No HSM or HJR Distal pulses intact with no bruits No edema Neuro non-focal Skin warm and dry No muscular weakness   Labs:   Lab Results  Component Value Date   WBC 6.9 01/05/2022   HGB 15.4 01/05/2022   HCT 46.5 01/05/2022   MCV 88.4 01/05/2022   PLT 207 01/05/2022    No results for input(s): "NA", "K", "CL", "CO2", "BUN", "CREATININE", "CALCIUM", "PROT", "BILITOT", "ALKPHOS", "ALT", "AST", "GLUCOSE" in the last 168 hours.  Invalid input(s): "LABALBU"  No results found for: "CKTOTAL", "CKMB", "CKMBINDEX", "TROPONINI"  Lab Results  Component Value Date   CHOL 110 01/05/2022   CHOL 152 01/01/2021   CHOL 111 04/15/2020   Lab Results  Component Value Date   HDL 51 01/05/2022   HDL 47 01/01/2021   HDL 29 (L) 04/15/2020   Lab Results  Component Value Date   LDLCALC 46 01/05/2022   LDLCALC 91 01/01/2021   LDLCALC 65 04/15/2020   Lab Results  Component Value Date   TRIG 57 01/05/2022   TRIG 57 01/01/2021   TRIG 90 04/15/2020   Lab Results  Component Value Date   CHOLHDL 2.2 01/05/2022   CHOLHDL 3.2 01/01/2021   CHOLHDL 3.8 04/15/2020   No  results found for: "LDLDIRECT"    Radiology: No results found.  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 Target LDL < 55 Most recent 46  DM:  Discussed low carb diet  target hemoglobin A1c is 6.5 or less.  Continue current medications. Follows with Dr Dwyane Dee   F/U in a year   Signed: Jenkins Rouge 05/19/2022, 8:49 AM

## 2022-05-19 ENCOUNTER — Ambulatory Visit: Payer: Medicare HMO | Attending: Cardiovascular Disease | Admitting: Cardiovascular Disease

## 2022-05-19 ENCOUNTER — Encounter: Payer: Self-pay | Admitting: Cardiovascular Disease

## 2022-05-19 VITALS — BP 118/70 | HR 66 | Ht 72.0 in | Wt 192.0 lb

## 2022-05-19 DIAGNOSIS — I2510 Atherosclerotic heart disease of native coronary artery without angina pectoris: Secondary | ICD-10-CM

## 2022-05-19 DIAGNOSIS — R931 Abnormal findings on diagnostic imaging of heart and coronary circulation: Secondary | ICD-10-CM | POA: Diagnosis not present

## 2022-05-19 DIAGNOSIS — E782 Mixed hyperlipidemia: Secondary | ICD-10-CM | POA: Diagnosis not present

## 2022-05-19 NOTE — Patient Instructions (Signed)

## 2022-06-17 ENCOUNTER — Other Ambulatory Visit (HOSPITAL_COMMUNITY): Payer: Self-pay

## 2022-07-24 ENCOUNTER — Other Ambulatory Visit: Payer: Self-pay | Admitting: Internal Medicine

## 2022-08-13 ENCOUNTER — Other Ambulatory Visit (INDEPENDENT_AMBULATORY_CARE_PROVIDER_SITE_OTHER): Payer: Medicare HMO

## 2022-08-13 DIAGNOSIS — E1165 Type 2 diabetes mellitus with hyperglycemia: Secondary | ICD-10-CM

## 2022-08-13 LAB — BASIC METABOLIC PANEL
BUN: 18 mg/dL (ref 6–23)
CO2: 27 mEq/L (ref 19–32)
Calcium: 9.4 mg/dL (ref 8.4–10.5)
Chloride: 103 mEq/L (ref 96–112)
Creatinine, Ser: 0.94 mg/dL (ref 0.40–1.50)
GFR: 80.54 mL/min (ref >60.00)
Glucose, Bld: 146 mg/dL — ABNORMAL HIGH (ref 70–99)
Potassium: 4.3 mEq/L (ref 3.5–5.1)
Sodium: 138 mEq/L (ref 135–145)

## 2022-08-13 LAB — HEMOGLOBIN A1C: Hgb A1c MFr Bld: 7 % — ABNORMAL HIGH (ref 4.6–6.5)

## 2022-08-17 ENCOUNTER — Ambulatory Visit: Payer: Medicare HMO | Admitting: Endocrinology

## 2022-08-17 VITALS — BP 124/70 | HR 60 | Ht 72.0 in | Wt 186.4 lb

## 2022-08-17 DIAGNOSIS — E1165 Type 2 diabetes mellitus with hyperglycemia: Secondary | ICD-10-CM | POA: Diagnosis not present

## 2022-08-17 DIAGNOSIS — Z7984 Long term (current) use of oral hypoglycemic drugs: Secondary | ICD-10-CM

## 2022-08-17 NOTE — Progress Notes (Signed)
Patient ID: Steven Wheeler, male   DOB: 1950/02/23, 72 y.o.   MRN: [id number]           Reason for Appointment: Follow-up for Type 2 Diabetes  Referring PCP: [facility]   History of Present Illness:          Date of diagnosis of type 2 diabetes mellitus:  2017      Background history:  He was asymptomatic at the time of diagnosis with A1c 6.9 He was started on metformin 500 mg twice daily which has been continued unchanged since then Has not been on any other medications  Recent history:   A1c is generally about the same and last 6.8 Fructosamine 259  Non-insulin hypoglycemic drugs the patient is taking are: Xigduo 06/998, 2 tablet daily  Current management, blood sugar patterns and problems identified:  He has been taking 2 tablets of the Xigduo in the evenings Occasionally may forget the medication if he is late at night He says that his A1c and sugars are likely higher from poor diet on and off for the last 3 months However has not gained weight, recent weight is slightly less Continues to walk with his walking daily As usual his fasting readings are relatively higher and does not have any consistently high readings after meals He tries to rotate his readings after meals and usually at 2 hours Lab fasting glucose 146 Also he thinks that when he checks his blood sugar after his walking it may be increased         Side effects from medications have been: None    Typical meal intake: Breakfast is cereal or oatmeal, bacon/eggs.  Usually fast food at dinnertime.  Snacks will be fruit, trail mix and popcorn               Exercise:  Walking 3 miles 6 days a week  Glucose monitoring:  About once a day      Glucometer: Verio     Blood Glucose readings by time of day and averages from meter review, could not be downloaded   PRE-MEAL Fasting Lunch Dinner Bedtime Overall  Glucose range: 122-153      Mean/median:     ?   POST-MEAL PC Breakfast PC Lunch PC   Glucose range:    106-169  Mean/median:      Prior  PRE-MEAL Fasting Lunch Dinner Bedtime Overall  Glucose range: 106-138      Mean/median:        POST-MEAL PC Breakfast PC Lunch PC Dinner  Glucose range: 153 118 ?  Mean/median:       Dietician visit, most recent: 2019  Weight history:  Wt Readings from Last 3 Encounters:  08/17/22 186 lb 6.4 oz (84.6 kg)  05/19/22 192 lb (87.1 kg)  03/18/22 189 lb 12.8 oz (86.1 kg)    Glycemic control:   Lab Results  Component Value Date   HGBA1C 7.0 (H) 08/13/2022   HGBA1C 6.8 (H) 01/05/2022   HGBA1C 7.1 (H) 10/09/2021   Lab Results  Component Value Date   MICROALBUR <0.2 01/07/2022   LDLCALC 46 01/05/2022   CREATININE 0.94 08/13/2022   Lab Results  Component Value Date   MICRALBCREAT NOTE 01/07/2022    Lab Results  Component Value Date   FRUCTOSAMINE 259 03/16/2022   FRUCTOSAMINE 316 (H) 07/14/2020    Lab on 08/13/2022  Component Date Value Ref Range Status   Sodium 08/13/2022 138  135 - 145 mEq/L Final  Potassium 08/13/2022 4.3  3.5 - 5.1 mEq/L Final   Chloride 08/13/2022 103  96 - 112 mEq/L Final   CO2 08/13/2022 27  19 - 32 mEq/L Final   Glucose, Bld 08/13/2022 146 (H)  70 - 99 mg/dL Final   BUN 16/11/9602 18  6 - 23 mg/dL Final   Creatinine, Ser 08/13/2022 0.94  0.40 - 1.50 mg/dL Final   GFR 54/10/8117 80.54  >60.00 mL/min Final   Calculated using the CKD-EPI Creatinine Equation (2021)   Calcium 08/13/2022 9.4  8.4 - 10.5 mg/dL Final   Hgb J4N MFr Bld 08/13/2022 7.0 (H)  4.6 - 6.5 % Final   Glycemic Control Guidelines for People with Diabetes:Non Diabetic:  <6%Goal of Therapy: <7%Additional Action Suggested:  >8%     Allergies as of 08/17/2022   No Known Allergies      Medication List        Accurate as of August 17, 2022  8:48 AM. If you have any questions, ask your nurse or doctor.          ARGININE PO Take by mouth.   BERBERINE COMPLEX PO Take by mouth.   CINNAMON PO Take by mouth.   co-enzyme Q-10 50 MG  capsule Take 50 mg by mouth daily.   KRILL OIL OMEGA-3 PO Take by mouth.   multivitamin tablet Take 1 tablet by mouth daily.   NON FORMULARY Tumeric, berberine?   NON FORMULARY 1 capsule daily at 2 am. Cholestrol complete   OneTouch Delica Lancets 33G Misc USE TO CHECK BLOOD SUGAR ONCE A DAY DX CODE E11.9   OneTouch Verio Flex System w/Device Kit Use to check blood sugar once daily  Dx code E11.9   OneTouch Verio test strip Generic drug: glucose blood USE AS INSTRUCTED TO CHECK BLOOD SUGAR ONCE A DAY DX CODE E11.9   rosuvastatin 20 MG tablet Commonly known as: CRESTOR TAKE ONE TABLET BY MOUTH ONE TIME DAILY   VITAMIN D PO Take by mouth.   Xigduo XR 06-998 MG Tb24 Generic drug: Dapagliflozin Pro-metFORMIN ER TAKE TWO TABLETS BY MOUTH DAILY        Allergies: No Known Allergies  Past Medical History:  Diagnosis Date   Diabetes mellitus without complication ([facility])    Diverticulosis    2009 Virtual colonoscopy   Hx of adenomatous polyp of colon 11/2020   6 mm - no routine repeat colonoscopy needed    Past Surgical History:  Procedure Laterality Date   virtual colonoscopy  2009   tics   WISDOM TOOTH EXTRACTION      Family History  Problem Relation Age of Onset   Heart disease Father    Diabetes Father    Obesity Father    Breast cancer Sister    Cancer Sister    Kidney Stones Brother    Colon cancer Neg Hx    Colon polyps Neg Hx    Esophageal cancer Neg Hx    Stomach cancer Neg Hx    Rectal cancer Neg Hx     Social History:  reports that he has never smoked. He has never used smokeless tobacco. He reports current alcohol use. He reports that he does not use drugs.   Review of Systems  Lipid history: Has not had hypercholesterolemia.    Since he had a high calcium score he is taking Crestor 20 mg daily since 02/10/2021 from his PCP  Lab Results  Component Value Date   CHOL 110 01/05/2022   CHOL 152  01/01/2021   CHOL 111 04/15/2020   Lab  Results  Component Value Date   HDL 51 01/05/2022   HDL 47 01/01/2021   HDL 29 (L) 04/15/2020   Lab Results  Component Value Date   LDLCALC 46 01/05/2022   LDLCALC 91 01/01/2021   LDLCALC 65 04/15/2020   Lab Results  Component Value Date   TRIG 57 01/05/2022   TRIG 57 01/01/2021   TRIG 90 04/15/2020   Lab Results  Component Value Date   CHOLHDL 2.2 01/05/2022   CHOLHDL 3.2 01/01/2021   CHOLHDL 3.8 04/15/2020   No results found for: "LDLDIRECT"          No history of hypertension:   BP Readings from Last 3 Encounters:  08/17/22 124/70  05/19/22 118/70  03/18/22 136/80    Most recent eye exam was in 2/24, normal   Currently known complications of diabetes: None  LABS:  Lab on 08/13/2022  Component Date Value Ref Range Status   Sodium 08/13/2022 138  135 - 145 mEq/L Final   Potassium 08/13/2022 4.3  3.5 - 5.1 mEq/L Final   Chloride 08/13/2022 103  96 - 112 mEq/L Final   CO2 08/13/2022 27  19 - 32 mEq/L Final   Glucose, Bld 08/13/2022 146 (H)  70 - 99 mg/dL Final   BUN 16/11/9602 18  6 - 23 mg/dL Final   Creatinine, Ser 08/13/2022 0.94  0.40 - 1.50 mg/dL Final   GFR 54/10/8117 80.54  >60.00 mL/min Final   Calculated using the CKD-EPI Creatinine Equation (2021)   Calcium 08/13/2022 9.4  8.4 - 10.5 mg/dL Final   Hgb J4N MFr Bld 08/13/2022 7.0 (H)  4.6 - 6.5 % Final   Glycemic Control Guidelines for People with Diabetes:Non Diabetic:  <6%Goal of Therapy: <7%Additional Action Suggested:  >8%     Physical Examination:  BP 124/70 (BP Location: Left Arm, Patient Position: Sitting, Cuff Size: Normal)   Pulse 60   Ht 6' (1.829 m)   Wt 186 lb 6.4 oz (84.6 kg)   SpO2 97%   BMI 25.28 kg/m       ASSESSMENT:  Diabetes type 2, non-insulin-dependent  See history of present illness for detailed discussion of current diabetes management, blood sugar patterns and problems identified  A1c is 7  Current treatment regimen is Xigduo 06/998, 2 tablets  daily  Although his A1c is slightly higher he recently does not have significantly high postprandial readings Overall his diet has been inconsistent over the last 3 months As before his fasting readings tend to be a little higher  He is asking about safety and side effects of both metformin and Marcelline Deist and discussed how these work and potential long-term benefits   PLAN:   Continue 2 tablets daily of Xigduo in the evening time at dinnertime and make sure he takes the dose every day Consistent diet To call if blood sugars are consistently high  He can come back for follow-up in 5 months  There are no Patient Instructions on file for this visit.      Reather Littler 08/17/2022, 8:48 AM   Note: This office note was prepared with Dragon voice recognition system technology. Any transcriptional errors that result from this process are unintentional.

## 2022-08-19 DIAGNOSIS — R69 Illness, unspecified: Secondary | ICD-10-CM | POA: Diagnosis not present

## 2022-08-20 ENCOUNTER — Other Ambulatory Visit: Payer: Self-pay | Admitting: Endocrinology

## 2022-08-22 DIAGNOSIS — R69 Illness, unspecified: Secondary | ICD-10-CM | POA: Diagnosis not present

## 2022-08-30 ENCOUNTER — Other Ambulatory Visit: Payer: Self-pay | Admitting: Endocrinology

## 2022-08-31 ENCOUNTER — Other Ambulatory Visit: Payer: Self-pay

## 2022-08-31 MED ORDER — ONETOUCH DELICA LANCETS 33G MISC: 3 refills | Status: AC

## 2022-09-26 ENCOUNTER — Other Ambulatory Visit: Payer: Self-pay | Admitting: Endocrinology

## 2023-01-03 NOTE — Progress Notes (Signed)
Annual Wellness Visit    Patient Care Team: Loki Wuthrich, Luanna Cole, MD as PCP - General (Internal Medicine) Wendall Stade, MD as PCP - Cardiology (Cardiology)  Visit Date: 01/10/23   Chief Complaint  Patient presents with   Medicare Wellness    Subjective:   Patient: Steven Wheeler, Male    DOB: 06/11/1950, 72 y.o.   MRN: [id number]  Steven Wheeler is a 72 y.o. Male who presents today for his Annual Wellness Visit. History of Type 2 diabetes mellitus, diverticulosis, adenomatous colonic polyps 2022, hyperlipidemia.  His general health is good.    Reports he does have some  medical office anxiety today that he believes may be contributing to his elevated blood pressure. This is a longstanding problem.  He has a history of type 2 diabetes mellitus and has a new endocrinologist, Dr. Erroll Luna.  Over the past couple of years his glucose control has not been quite as good as I would like to see it.  Dr. Lucianne Muss initially saw him in April 2022.  Currently on Xigduo.  He will be seeing endocrinologist, Dr. Iraq Thapa tomorrow. HGBA1c at 7.5% on 01/06/23, up from 7.0% on 08/13/22. He walks three miles daily. He believes he has been eating poorly recently.  History of hyperlipidemia treated with rosuvastatin 20 mg daily. History of coronary artery disease and followed by Dr. Eden Emms. Lipid panel normal.   Past medical history: Wisdom teeth extraction at age 53.  Hepatitis A around 1982.  History of fractured left arm at age 89.  Up until recently did not want to be on statin medication.  History of borderline hypertension and metabolic syndrome.  Dr. Leone Payor has indicated to him that colonoscopy done in 2022 was stable and does not need to be repeated.  01/06/23 labs reviewed today. Glucose elevated at 123. Kidney, liver functions normal. Electrolytes normal. Blood proteins normal. HCT elevated at 50.6. PSA at 0.62. Creatinine and microalbumin normal.  Social history: Daughter has a child and  he spends time with them.  He has 2 adult daughters total.  He is a retired Engineer, drilling.  Non-smoker.  Social alcohol consumption. His wife passed away of complications of breast cancer in 2020.     Family history: Mother died of an MI.  Mother passed away of complications of septicemia in her late 76s with history of dementia, congestive heart failure and vision loss.  1 brother in good health.  3 sisters-1 died of cancer.  2 sisters living- one of them has hypertension and claudication symptoms.  Past Medical History:  Diagnosis Date   Diabetes mellitus without complication (HCC)    Diverticulosis    2009 Virtual colonoscopy   Hx of adenomatous polyp of colon 11/2020   6 mm - no routine repeat colonoscopy needed     Family History  Problem Relation Age of Onset   Heart disease Father    Diabetes Father    Obesity Father    Breast cancer Sister    Cancer Sister    Kidney Stones Brother    Colon cancer Neg Hx    Colon polyps Neg Hx    Esophageal cancer Neg Hx    Stomach cancer Neg Hx    Rectal cancer Neg Hx          Review of Systems  Constitutional:  Negative for chills, fever, malaise/fatigue and weight loss.  HENT:  Negative for hearing loss, sinus pain and sore throat.   Respiratory:  Negative for cough, hemoptysis and shortness of breath.   Cardiovascular:  Negative for chest pain, palpitations, leg swelling and PND.  Gastrointestinal:  Negative for abdominal pain, constipation, diarrhea, heartburn, nausea and vomiting.  Genitourinary:  Negative for dysuria, frequency and urgency.  Musculoskeletal:  Negative for back pain, myalgias and neck pain.  Skin:  Negative for itching and rash.  Neurological:  Negative for dizziness, tingling, seizures and headaches.  Endo/Heme/Allergies:  Negative for polydipsia.  Psychiatric/Behavioral:  Negative for depression. The patient is nervous/anxious.       Objective:   Vitals: BP (!) 160/80   Pulse 68   Ht 6' (1.829  m)   Wt 190 lb (86.2 kg)   SpO2 98%   BMI 25.77 kg/m   Physical Exam Vitals and nursing note reviewed.  Constitutional:      General: He is awake. He is not in acute distress.    Appearance: Normal appearance. He is not ill-appearing or toxic-appearing.  HENT:     Head: Normocephalic and atraumatic.     Right Ear: Hearing, tympanic membrane, ear canal and external ear normal.     Left Ear: Hearing, tympanic membrane, ear canal and external ear normal.     Mouth/Throat:     Pharynx: Oropharynx is clear.  Eyes:     Extraocular Movements: Extraocular movements intact.     Pupils: Pupils are equal, round, and reactive to light.  Neck:     Thyroid: No thyroid mass, thyromegaly or thyroid tenderness.     Vascular: No carotid bruit.  Cardiovascular:     Rate and Rhythm: Normal rate and regular rhythm. No extrasystoles are present.    Pulses:          Dorsalis pedis pulses are 1+ on the right side and 1+ on the left side.       Posterior tibial pulses are 1+ on the right side and 1+ on the left side.     Heart sounds: Normal heart sounds. No murmur heard.    No friction rub. No gallop.  Pulmonary:     Effort: Pulmonary effort is normal.     Breath sounds: Normal breath sounds. No decreased breath sounds, wheezing, rhonchi or rales.  Chest:     Chest wall: No mass.  Abdominal:     General: Abdomen is flat. There is abdominal bruit.     Palpations: Abdomen is soft. There is no hepatomegaly, splenomegaly or mass.     Tenderness: There is no abdominal tenderness.     Hernia: No hernia is present.  Genitourinary:    Prostate: Normal. Not enlarged and no nodules present.     Comments: Prostate smooth and symmetrical. Stool guaiac negative. Musculoskeletal:     Cervical back: Normal range of motion.     Right lower leg: No edema.     Left lower leg: No edema.  Feet:     Comments: Sensation intact. Warm to touch.  No lesions. Lymphadenopathy:     Cervical: No cervical adenopathy.      Upper Body:     Right upper body: No supraclavicular adenopathy.     Left upper body: No supraclavicular adenopathy.  Skin:    General: Skin is warm and dry.  Neurological:     General: No focal deficit present.     Mental Status: He is alert and oriented to person, place, and time. Mental status is at baseline.     Cranial Nerves: Cranial nerves 2-12 are intact.  Sensory: Sensation is intact.     Motor: Motor function is intact.     Coordination: Coordination is intact.     Gait: Gait is intact.     Deep Tendon Reflexes: Reflexes are normal and symmetric.  Psychiatric:        Attention and Perception: Attention normal.        Mood and Affect: Mood normal.        Speech: Speech normal.        Behavior: Behavior normal. Behavior is cooperative.        Thought Content: Thought content normal.        Cognition and Memory: Cognition and memory normal.        Judgment: Judgment normal.      Most recent functional status assessment:    01/10/2023    9:47 AM  In your present state of health, do you have any difficulty performing the following activities:  Hearing? 0  Vision? 0  Difficulty concentrating or making decisions? 0  Walking or climbing stairs? 0  Dressing or bathing? 0  Doing errands, shopping? 0  Preparing Food and eating ? N  Using the Toilet? N  In the past six months, have you accidently leaked urine? N  Do you have problems with loss of bowel control? N  Managing your Medications? N  Managing your Finances? N  Housekeeping or managing your Housekeeping? N   Most recent fall risk assessment:    01/10/2023    9:47 AM  Fall Risk   Falls in the past year? 0  Number falls in past yr: 0  Injury with Fall? 0  Risk for fall due to : No Fall Risks  Follow up Falls evaluation completed;Falls prevention discussed    Most recent depression screenings:    01/10/2023   10:04 AM 01/07/2022    3:02 PM  PHQ 2/9 Scores  PHQ - 2 Score 0 0   Most recent  cognitive screening:    01/10/2023   10:01 AM  6CIT Screen  What Year? 0 points  What month? 0 points  What time? 0 points  Count back from 20 0 points  Months in reverse 0 points  Repeat phrase 0 points  Total Score 0 points     Results:   Studies obtained and personally reviewed by me:  Dr. Leone Payor has indicated to him that colonoscopy done in 2022 was stable and does not need to be repeated.  Labs:       Component Value Date/Time   NA 137 01/06/2023 0906   K 4.9 01/06/2023 0906   CL 100 01/06/2023 0906   CO2 28 01/06/2023 0906   GLUCOSE 123 (H) 01/06/2023 0906   BUN 22 01/06/2023 0906   CREATININE 0.86 01/06/2023 0906   CALCIUM 10.0 01/06/2023 0906   PROT 7.4 01/06/2023 0906   ALBUMIN 4.3 10/28/2015 0920   AST 26 01/06/2023 0906   ALT 18 01/06/2023 0906   ALKPHOS 60 10/28/2015 0920   BILITOT 0.5 01/06/2023 0906   GFRNONAA 81 04/15/2020 0943   GFRAA 94 04/15/2020 0943     Lab Results  Component Value Date   WBC 8.2 01/06/2023   HGB 16.3 01/06/2023   HCT 50.6 (H) 01/06/2023   MCV 88.8 01/06/2023   PLT 256 01/06/2023    Lab Results  Component Value Date   CHOL 118 01/06/2023   HDL 53 01/06/2023   LDLCALC 47 01/06/2023   TRIG 92 01/06/2023   CHOLHDL 2.2 01/06/2023  Lab Results  Component Value Date   HGBA1C 7.5 (H) 01/06/2023     No results found for: "TSH"   Lab Results  Component Value Date   PSA 0.62 01/06/2023   PSA 0.49 01/05/2022   PSA 0.65 01/01/2021    Assessment & Plan:   Type 2 diabetes mellitus: currently on Xigduo.  He will be seeing endocrinologist, Dr. Iraq Thapa tomorrow. HGBA1c at 7.5% on 01/06/23, up from 7.0% on 08/13/22.   Hyperlipidemia: treated with rosuvastatin 20 mg daily. Followed by Dr. Eden Emms yearly. Lipid panel normal.Hx high coronary calcium score.  Urinalysis today showed glucose at 500.  Dr. Leone Payor has indicated to him that colonoscopy done in 2022 was stable and does not need to be  repeated.  Abdominal bruit appreciated today on auscultation. Ordered abdominal aortic ultrasound. Addendum: ultrasound was negative. Likely heard this because he is not obese. 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  Vaccine counseling: UTD on pneumococcal-20, influenza, Covid-19 vaccines.  Return in 1 year or as needed.     Annual wellness visit done today including the all of the following: Reviewed patient's Family Medical History Reviewed and updated list of patient's medical providers Assessment of cognitive impairment was done Assessed patient's functional ability Established a written schedule for health screening services Health Risk Assessent Completed and Reviewed  Discussed health benefits of physical activity, and encouraged him to engage in regular exercise appropriate for his age and condition.        I,Alexander Ruley,acting as a Neurosurgeon for Margaree Mackintosh, MD.,have documented all relevant documentation on the behalf of Margaree Mackintosh, MD,as directed by  Margaree Mackintosh, MD while in the presence of Margaree Mackintosh, MD.   I, Margaree Mackintosh, MD, have reviewed all documentation for this visit. The documentation on 01/21/23 for the exam, diagnosis, procedures, and orders are all accurate and complete.

## 2023-01-06 ENCOUNTER — Other Ambulatory Visit: Payer: Medicare HMO

## 2023-01-06 DIAGNOSIS — N529 Male erectile dysfunction, unspecified: Secondary | ICD-10-CM

## 2023-01-06 DIAGNOSIS — E119 Type 2 diabetes mellitus without complications: Secondary | ICD-10-CM

## 2023-01-06 DIAGNOSIS — Z13220 Encounter for screening for lipoid disorders: Secondary | ICD-10-CM

## 2023-01-06 DIAGNOSIS — E8881 Metabolic syndrome: Secondary | ICD-10-CM

## 2023-01-06 DIAGNOSIS — Z0000 Encounter for general adult medical examination without abnormal findings: Secondary | ICD-10-CM

## 2023-01-07 LAB — COMPLETE METABOLIC PANEL WITH GFR
AG Ratio: 1.7 (calc) (ref 1.0–2.5)
ALT: 18 U/L (ref 9–46)
AST: 26 U/L (ref 10–35)
Albumin: 4.7 g/dL (ref 3.6–5.1)
Alkaline phosphatase (APISO): 68 U/L (ref 35–144)
BUN: 22 mg/dL (ref 7–25)
CO2: 28 mmol/L (ref 20–32)
Calcium: 10 mg/dL (ref 8.6–10.3)
Chloride: 100 mmol/L (ref 98–110)
Creat: 0.86 mg/dL (ref 0.70–1.28)
Globulin: 2.7 g/dL (ref 1.9–3.7)
Glucose, Bld: 123 mg/dL — ABNORMAL HIGH (ref 65–99)
Potassium: 4.9 mmol/L (ref 3.5–5.3)
Sodium: 137 mmol/L (ref 135–146)
Total Bilirubin: 0.5 mg/dL (ref 0.2–1.2)
Total Protein: 7.4 g/dL (ref 6.1–8.1)
eGFR: 91 mL/min/{1.73_m2} (ref >=60)

## 2023-01-07 LAB — CBC WITH DIFFERENTIAL/PLATELET
Absolute Lymphocytes: 2386 {cells}/uL (ref 850–3900)
Absolute Monocytes: 779 {cells}/uL (ref 200–950)
Basophils Absolute: 82 {cells}/uL (ref 0–200)
Basophils Relative: 1 %
Eosinophils Absolute: 213 {cells}/uL (ref 15–500)
Eosinophils Relative: 2.6 %
HCT: 50.6 % — ABNORMAL HIGH (ref 38.5–50.0)
Hemoglobin: 16.3 g/dL (ref 13.2–17.1)
MCH: 28.6 pg (ref 27.0–33.0)
MCHC: 32.2 g/dL (ref 32.0–36.0)
MCV: 88.8 fL (ref 80.0–100.0)
MPV: 10 fL (ref 7.5–12.5)
Monocytes Relative: 9.5 %
Neutro Abs: 4740 {cells}/uL (ref 1500–7800)
Neutrophils Relative %: 57.8 %
Platelets: 256 10*3/uL (ref 140–400)
RBC: 5.7 10*6/uL (ref 4.20–5.80)
RDW: 11.8 % (ref 11.0–15.0)
Total Lymphocyte: 29.1 %
WBC: 8.2 10*3/uL (ref 3.8–10.8)

## 2023-01-07 LAB — LIPID PANEL
Cholesterol: 118 mg/dL (ref <200)
HDL: 53 mg/dL (ref >=40)
LDL Cholesterol (Calc): 47 mg/dL
Non-HDL Cholesterol (Calc): 65 mg/dL (ref <130)
Total CHOL/HDL Ratio: 2.2 (calc) (ref <5.0)
Triglycerides: 92 mg/dL (ref <150)

## 2023-01-07 LAB — MICROALBUMIN / CREATININE URINE RATIO
Creatinine, Urine: 98 mg/dL (ref 20–320)
Microalb Creat Ratio: 3 mg/g{creat} (ref <30)
Microalb, Ur: 0.3 mg/dL

## 2023-01-07 LAB — HEMOGLOBIN A1C
Hgb A1c MFr Bld: 7.5 %{Hb} — ABNORMAL HIGH (ref <5.7)
Mean Plasma Glucose: 169 mg/dL
eAG (mmol/L): 9.3 mmol/L

## 2023-01-07 LAB — PSA: PSA: 0.62 ng/mL (ref <=4.00)

## 2023-01-10 ENCOUNTER — Encounter: Payer: Self-pay | Admitting: Internal Medicine

## 2023-01-10 ENCOUNTER — Ambulatory Visit: Payer: Medicare HMO | Admitting: Internal Medicine

## 2023-01-10 VITALS — BP 160/80 | HR 68 | Ht 72.0 in | Wt 190.0 lb

## 2023-01-10 DIAGNOSIS — E781 Pure hyperglyceridemia: Secondary | ICD-10-CM

## 2023-01-10 DIAGNOSIS — Z8249 Family history of ischemic heart disease and other diseases of the circulatory system: Secondary | ICD-10-CM

## 2023-01-10 DIAGNOSIS — R0989 Other specified symptoms and signs involving the circulatory and respiratory systems: Secondary | ICD-10-CM | POA: Diagnosis not present

## 2023-01-10 DIAGNOSIS — R931 Abnormal findings on diagnostic imaging of heart and coronary circulation: Secondary | ICD-10-CM | POA: Diagnosis not present

## 2023-01-10 DIAGNOSIS — E119 Type 2 diabetes mellitus without complications: Secondary | ICD-10-CM

## 2023-01-10 DIAGNOSIS — Z0000 Encounter for general adult medical examination without abnormal findings: Secondary | ICD-10-CM | POA: Diagnosis not present

## 2023-01-10 DIAGNOSIS — Z860101 Personal history of adenomatous and serrated colon polyps: Secondary | ICD-10-CM | POA: Diagnosis not present

## 2023-01-10 LAB — POCT URINALYSIS DIP (CLINITEK)
Bilirubin, UA: NEGATIVE
Blood, UA: NEGATIVE
Glucose, UA: 500 mg/dL — AB
Ketones, POC UA: NEGATIVE mg/dL
Leukocytes, UA: NEGATIVE
Nitrite, UA: NEGATIVE
POC PROTEIN,UA: NEGATIVE
Spec Grav, UA: 1.015 (ref 1.010–1.025)
Urobilinogen, UA: 0.2 U/dL
pH, UA: 6 (ref 5.0–8.0)

## 2023-01-11 ENCOUNTER — Ambulatory Visit: Payer: Medicare HMO | Admitting: Endocrinology

## 2023-01-11 ENCOUNTER — Encounter: Payer: Self-pay | Admitting: Internal Medicine

## 2023-01-11 ENCOUNTER — Encounter: Payer: Self-pay | Admitting: Endocrinology

## 2023-01-11 VITALS — BP 128/80 | HR 64 | Resp 20 | Ht 72.0 in | Wt 190.8 lb

## 2023-01-11 DIAGNOSIS — Z7984 Long term (current) use of oral hypoglycemic drugs: Secondary | ICD-10-CM

## 2023-01-11 DIAGNOSIS — E1165 Type 2 diabetes mellitus with hyperglycemia: Secondary | ICD-10-CM

## 2023-01-11 MED ORDER — DAPAGLIFLOZIN PRO-METFORMIN ER 5-1000 MG PO TB24: 2.0000 | ORAL_TABLET | Freq: Every day | ORAL | 3 refills | Status: DC

## 2023-01-11 NOTE — Progress Notes (Signed)
Outpatient Endocrinology Note Steven Justun Anaya, MD  01/11/23  Patient's Name: Steven Wheeler    DOB: 05/29/1950    MRN: [id number]                                                    REASON OF VISIT: Follow up of type 2 diabetes mellitus  PCP: Margaree Mackintosh, MD  HISTORY OF PRESENT ILLNESS:   Steven Wheeler is a 72 y.o. old male with past medical history listed below, is here for follow up for type 2 diabetes mellitus.  Patient was last seen by Dr. Lucianne Muss in June 2024.  Pertinent Diabetes History: Patient was diagnosed with type 2 diabetes mellitus in 2017.  Hemoglobin A1c was 6.9% at the time of diagnosis.  He has controlled type 2 diabetes mellitus.  Chronic Diabetes Complications : Retinopathy: no. Last ophthalmology exam was done on annually, following with ophthalmology regularly.  Nephropathy: no Peripheral neuropathy: no Coronary artery disease: no Stroke: no  Relevant comorbidities and cardiovascular risk factors: Obesity: no Body mass index is 25.88 kg/m.  Hypertension: no  Hyperlipidemia : Yes, on statin   Current / Home Diabetic regimen includes: Xigduo extended release 06/998 mg 2 tablets daily.  Prior diabetic medications:  Glycemic data:   He has been using One Touch Verio flex glucometer.  He is generally checking once a day.  2 at different times of the day.  In last 2 weeks from November 5 to November 19 average blood sugar 139.  Lowest blood sugar 102, highest blood sugar 166.  Morning blood sugar 122, 153, 152, 162.  In the afternoon blood sugar 109, 102, 166, 116.  Acceptable blood sugars.  Hypoglycemia: Patient has no hypoglycemic episodes. Patient has hypoglycemia awareness.  Factors modifying glucose control: 1.  Diabetic diet assessment: 3 meals a day.  2.  Staying active or exercising: Regularly walks every day about 3 miles a day.  3.  Medication compliance: compliant all of the time.  Interval history  Patient reports she has not been as  good for the diet control and had eaten desserts and other unhealthy meals, mainly when he is eating outside.  Hemoglobin A1c increased to 7.5%.  Blood sugar data as reviewed above mostly acceptable.  Recent lab results reviewed including lipid panel, electrolytes, hemoglobin A1c and urine microalbumin creatinine ratio are all acceptable.  No other complaints today.  REVIEW OF SYSTEMS As per history of present illness.   PAST MEDICAL HISTORY: Past Medical History:  Diagnosis Date   Diabetes mellitus without complication (HCC)    Diverticulosis    2009 Virtual colonoscopy   Hx of adenomatous polyp of colon 11/2020   6 mm - no routine repeat colonoscopy needed    PAST SURGICAL HISTORY: Past Surgical History:  Procedure Laterality Date   virtual colonoscopy  2009   tics   WISDOM TOOTH EXTRACTION      ALLERGIES: No Known Allergies  FAMILY HISTORY:  Family History  Problem Relation Age of Onset   Heart disease Father    Diabetes Father    Obesity Father    Breast cancer Sister    Cancer Sister    Kidney Stones Brother    Colon cancer Neg Hx    Colon polyps Neg Hx    Esophageal cancer Neg Hx    Stomach  cancer Neg Hx    Rectal cancer Neg Hx     SOCIAL HISTORY: Social History   Socioeconomic History   Marital status: Widowed    Spouse name: Not on file   Number of children: Not on file   Years of education: Not on file   Highest education level: Not on file  Occupational History   Not on file  Tobacco Use   Smoking status: Never   Smokeless tobacco: Never  Vaping Use   Vaping status: Never Used  Substance and Sexual Activity   Alcohol use: Yes    Comment: rare once a month maybe   Drug use: No   Sexual activity: Not on file  Other Topics Concern   Not on file  Social History Narrative   Not on file   Social Determinants of Health   Financial Resource Strain: Low Risk  (01/07/2023)   Overall Financial Resource Strain (CARDIA)    Difficulty of Paying  Living Expenses: Not hard at all  Food Insecurity: No Food Insecurity (01/07/2023)   Hunger Vital Sign    Worried About Running Out of Food in the Last Year: Never true    Ran Out of Food in the Last Year: Never true  Transportation Needs: No Transportation Needs (01/07/2023)   PRAPARE - Administrator, Civil Service (Medical): No    Lack of Transportation (Non-Medical): No  Physical Activity: Sufficiently Active (01/07/2023)   Exercise Vital Sign    Days of Exercise per Week: 7 days    Minutes of Exercise per Session: 60 min  Stress: No Stress Concern Present (01/07/2023)   [employer] of Occupational Health - Occupational Stress Questionnaire    Feeling of Stress : Not at all  Social Connections: Moderately Integrated (01/07/2023)   Social Connection and Isolation Panel [NHANES]    Frequency of Communication with Friends and Family: More than three times a week    Frequency of Social Gatherings with Friends and Family: More than three times a week    Attends Religious Services: More than 4 times per year    Active Member of [employer] or Organizations: Yes    Attends Banker Meetings: More than 4 times per year    Marital Status: Widowed    MEDICATIONS:  Current Outpatient Medications  Medication Sig Dispense Refill   ARGININE PO Take by mouth.     Barberry-Oreg Grape-Goldenseal (BERBERINE COMPLEX PO) Take by mouth.     Blood Glucose Monitoring Suppl (ONETOUCH VERIO FLEX SYSTEM) w/Device KIT Use to check blood sugar once daily  Dx code E11.9 1 kit 0   Cholecalciferol (VITAMIN D PO) Take by mouth.     CINNAMON PO Take by mouth.     co-enzyme Q-10 50 MG capsule Take 50 mg by mouth daily.     KRILL OIL OMEGA-3 PO Take by mouth.     Multiple Vitamin (MULTIVITAMIN) tablet Take 1 tablet by mouth daily.     NON FORMULARY Tumeric, berberine?     NON FORMULARY 1 capsule daily at 2 am. Cholestrol complete     OneTouch Delica Lancets 33G MISC USE TO CHECK BLOOD  SUGAR ONCE A DAY DX CODE E11.9 100 each 3   ONETOUCH VERIO test strip USE AS INSTRUCTED TO CHECK BLOOD SUGAR ONCE A DAY DX CODE E11.9 100 strip 2   rosuvastatin (CRESTOR) 20 MG tablet TAKE ONE TABLET BY MOUTH ONE TIME DAILY 90 tablet 3   Dapagliflozin Pro-metFORMIN ER (XIGDUO XR)  06-998 MG TB24 Take 2 tablets by mouth daily. 180 tablet 3   No current facility-administered medications for this visit.   Facility-Administered Medications Ordered in Other Visits  Medication Dose Route Frequency Provider Last Rate Last Admin   technetium tetrofosmin (TC-MYOVIEW) injection 32.2 millicurie  32.2 millicurie Intravenous Once PRN Croitoru, Mihai, MD        PHYSICAL EXAM: Vitals:   01/11/23 0843 01/11/23 0844  BP: (!) 150/80 128/80  Pulse: 64   Resp: 20   SpO2: 96%   Weight: 190 lb 12.8 oz (86.5 kg)   Height: 6' (1.829 m)    Body mass index is 25.88 kg/m.  Wt Readings from Last 3 Encounters:  01/11/23 190 lb 12.8 oz (86.5 kg)  01/10/23 190 lb (86.2 kg)  08/17/22 186 lb 6.4 oz (84.6 kg)    General: Well developed, well nourished male in no apparent distress.  HEENT: AT/[place], no external lesions.  Eyes: Conjunctiva clear and no icterus. Neck: Neck supple  Lungs: Respirations not labored Neurologic: Alert, oriented, normal speech Extremities / Skin: Dry.  Psychiatric: Does not appear depressed or anxious  Diabetic Foot Exam - Simple   No data filed    LABS Reviewed Lab Results  Component Value Date   HGBA1C 7.5 (H) 01/06/2023   HGBA1C 7.0 (H) 08/13/2022   HGBA1C 6.8 (H) 01/05/2022   Lab Results  Component Value Date   FRUCTOSAMINE 259 03/16/2022   FRUCTOSAMINE 316 (H) 07/14/2020   Lab Results  Component Value Date   CHOL 118 01/06/2023   HDL 53 01/06/2023   LDLCALC 47 01/06/2023   TRIG 92 01/06/2023   CHOLHDL 2.2 01/06/2023   Lab Results  Component Value Date   MICRALBCREAT 3 01/06/2023   MICRALBCREAT NOTE 01/07/2022   Lab Results  Component Value Date    CREATININE 0.86 01/06/2023   Lab Results  Component Value Date   GFR 80.54 08/13/2022    ASSESSMENT / PLAN  1. Uncontrolled type 2 diabetes mellitus with hyperglycemia, without long-term current use of insulin (HCC)     Diabetes Mellitus type 2, complicated by no known complications. - Diabetic status / severity: Uncontrolled.  Lab Results  Component Value Date   HGBA1C 7.5 (H) 01/06/2023    - Hemoglobin A1c goal : <7%  Recent worsening of diabetes control, discussed about diet control.  Also advised to walk or do exercise after eating especially high carb meals.  - Medications: No change.  I) continue Xigduo extended release 06/998 mg 2 tablets daily.  - Home glucose testing: Few times a week at different times of the day. - Discussed/ Gave Hypoglycemia treatment plan.  # Consult : not required at this time.   # Annual urine for microalbuminuria/ creatinine ratio, no microalbuminuria currently. Last  Lab Results  Component Value Date   MICRALBCREAT 3 01/06/2023    # Foot check nightly.  # Annual dilated diabetic eye exams.   - Diet: Make healthy diabetic food choices - Life style / activity / exercise: Discussed.  2. Blood pressure  -  BP Readings from Last 1 Encounters:  01/11/23 128/80    - Control is in target.  - No change in current plans.  3. Lipid status / Hyperlipidemia - Last  Lab Results  Component Value Date   LDLCALC 47 01/06/2023   - Continue rosuvastatin 20 mg daily.  Diagnoses and all orders for this visit:  Uncontrolled type 2 diabetes mellitus with hyperglycemia, without long-term current use of insulin (HCC) -  Hemoglobin A1c -     Basic metabolic panel  Other orders -     Dapagliflozin Pro-metFORMIN ER (XIGDUO XR) 06-998 MG TB24; Take 2 tablets by mouth daily.    DISPOSITION Follow up in clinic in 4  months suggested.   All questions answered and patient verbalized understanding of the plan.  Steven Lesa Vandall,  MD [facility] Endocrinology [facility] [address] [place], [address] [place], [place] [address] Phone # [phone]  At least part of this note was generated using voice recognition software. Inadvertent word errors may have occurred, which were not recognized during the proofreading process.

## 2023-01-13 ENCOUNTER — Telehealth: Payer: Self-pay | Admitting: Internal Medicine

## 2023-01-13 NOTE — Telephone Encounter (Signed)
Called and let patient know that an abdominal US had been ordered instead of an MRI, he verbalized understanding.

## 2023-01-13 NOTE — Telephone Encounter (Signed)
Checking to see what we need MRI for, this appears to have been discussed during an Medicare Wellness Exam. Called patient and he said that you heard a bluey when you listened to his stomach and was concerned so you wanted to get an MRI.

## 2023-01-13 NOTE — Telephone Encounter (Signed)
Copied from CRM [id number]. Topic: Clinical - Request for Lab/Test Order >> Jan 13, 2023  9:22 AM Hendricks Limes wrote: Reason for CRM: Dr Lenord Fellers told the PT at last appointment that he needed to get an MRI and they would give him a call and to contact her if they didn't call him in the next couple of days. He has not received a call from anyone to schedule the MRI and would like to know next steps.

## 2023-01-14 ENCOUNTER — Encounter: Payer: Self-pay | Admitting: Internal Medicine

## 2023-01-18 ENCOUNTER — Ambulatory Visit
Admission: RE | Admit: 2023-01-18 | Discharge: 2023-01-18 | Disposition: A | Discharge status: Home / Self Care | Payer: Medicare HMO | Source: Ambulatory Visit | Attending: Internal Medicine | Admitting: Internal Medicine

## 2023-01-19 ENCOUNTER — Other Ambulatory Visit: Payer: Medicare HMO

## 2023-01-21 NOTE — Patient Instructions (Signed)
We have ordered an abdominal ultrasound because I heard a bruit today on your exam.  We will contact you with results.  Addendum: No evidence of abdominal aortic aneurysm.  Continue current medications and follow-up with Endocrinology and Cardiology.  Please return in 1 year or as needed.  No change in medications.

## 2023-02-11 NOTE — Telephone Encounter (Signed)
Requested HIM to add notes from the media tab to the abstract order completed by practice. Scanned as AMB Correspondence instead of attached to the abstracted order.

## 2023-03-22 ENCOUNTER — Ambulatory Visit
Admission: EM | Admit: 2023-03-22 | Discharge: 2023-03-22 | Disposition: A | Discharge status: Home / Self Care | Payer: Medicare Other | Attending: Family Medicine | Admitting: Family Medicine

## 2023-03-22 ENCOUNTER — Ambulatory Visit: Payer: Self-pay | Admitting: Internal Medicine

## 2023-03-22 DIAGNOSIS — L02415 Cutaneous abscess of right lower limb: Secondary | ICD-10-CM

## 2023-03-22 MED ORDER — DOXYCYCLINE HYCLATE 100 MG PO CAPS: 100.0000 mg | ORAL_CAPSULE | Freq: Two times a day (BID) | ORAL | 0 refills | Status: DC

## 2023-03-22 NOTE — ED Provider Notes (Signed)
[facility]  Note:  This document was prepared using Conservation officer, historic buildings and may include unintentional dictation errors.  MRN: [id number] DOB: [birth year]  Subjective:   Steven Wheeler is a 73 y.o. male presenting for 2-day history of acute onset persistent right inner thigh swelling.  Yesterday he got the area to pop and drain.  Swelling has decreased and so has the pain.  No fever, nausea, vomiting.  No current facility-administered medications for this encounter.  Current Outpatient Medications:    ARGININE PO, Take by mouth., Disp: , Rfl:    Barberry-Oreg Grape-Goldenseal (BERBERINE COMPLEX PO), Take by mouth., Disp: , Rfl:    Blood Glucose Monitoring Suppl (ONETOUCH VERIO FLEX SYSTEM) w/Device KIT, Use to check blood sugar once daily  Dx code E11.9, Disp: 1 kit, Rfl: 0   Cholecalciferol (VITAMIN D PO), Take by mouth., Disp: , Rfl:    CINNAMON PO, Take by mouth., Disp: , Rfl:    co-enzyme Q-10 50 MG capsule, Take 50 mg by mouth daily., Disp: , Rfl:    Dapagliflozin Pro-metFORMIN ER (XIGDUO XR) 06-998 MG TB24, Take 2 tablets by mouth daily., Disp: 180 tablet, Rfl: 3   KRILL OIL OMEGA-3 PO, Take by mouth., Disp: , Rfl:    Multiple Vitamin (MULTIVITAMIN) tablet, Take 1 tablet by mouth daily., Disp: , Rfl:    NON FORMULARY, Tumeric, berberine?, Disp: , Rfl:    NON FORMULARY, 1 capsule daily at 2 am. Cholestrol complete, Disp: , Rfl:    OneTouch Delica Lancets 33G MISC, USE TO CHECK BLOOD SUGAR ONCE A DAY DX CODE E11.9, Disp: 100 each, Rfl: 3   ONETOUCH VERIO test strip, USE AS INSTRUCTED TO CHECK BLOOD SUGAR ONCE A DAY DX CODE E11.9, Disp: 100 strip, Rfl: 2   rosuvastatin (CRESTOR) 20 MG tablet, TAKE ONE TABLET BY MOUTH ONE TIME DAILY, Disp: 90 tablet, Rfl: 3  Facility-Administered Medications Ordered in Other Encounters:    technetium tetrofosmin (TC-MYOVIEW) injection 32.2 millicurie, 32.2 millicurie, Intravenous, Once PRN, Croitoru, Mihai, MD    No Known Allergies  Past Medical History:  Diagnosis Date   Diabetes mellitus without complication (HCC)    Diverticulosis    2009 Virtual colonoscopy   Hx of adenomatous polyp of colon 11/2020   6 mm - no routine repeat colonoscopy needed     Past Surgical History:  Procedure Laterality Date   virtual colonoscopy  2009   tics   WISDOM TOOTH EXTRACTION      Family History  Problem Relation Age of Onset   Heart disease Father    Diabetes Father    Obesity Father    Breast cancer Sister    Cancer Sister    Kidney Stones Brother    Colon cancer Neg Hx    Colon polyps Neg Hx    Esophageal cancer Neg Hx    Stomach cancer Neg Hx    Rectal cancer Neg Hx     Social History   Tobacco Use   Smoking status: Never   Smokeless tobacco: Never  Vaping Use   Vaping status: Never Used  Substance Use Topics   Alcohol use: Yes    Comment: rare once a month maybe   Drug use: No    ROS   Objective:   Vitals: BP (!) 159/83 (BP Location: Right Arm)   Pulse 80   Temp 98.1 F (36.7 C) (Oral)   Resp 16   SpO2 95%   Physical Exam Constitutional:  General: He is not in acute distress.    Appearance: Normal appearance. He is well-developed and normal weight. He is not ill-appearing, toxic-appearing or diaphoretic.  HENT:     Head: Normocephalic and atraumatic.     Right Ear: External ear normal.     Left Ear: External ear normal.     Nose: Nose normal.     Mouth/Throat:     Pharynx: Oropharynx is clear.  Eyes:     General: No scleral icterus.       Right eye: No discharge.        Left eye: No discharge.     Extraocular Movements: Extraocular movements intact.  Cardiovascular:     Rate and Rhythm: Normal rate.  Pulmonary:     Effort: Pulmonary effort is normal.  Genitourinary:   Musculoskeletal:     Cervical back: Normal range of motion.  Neurological:     Mental Status: He is alert and oriented to person, place, and time.  Psychiatric:        Mood and  Affect: Mood normal.        Behavior: Behavior normal.        Thought Content: Thought content normal.        Judgment: Judgment normal.     Assessment and Plan :   PDMP not reviewed this encounter.  1. Abscess of right thigh    Areas ruptured and draining, not amenable to incision and drainage.  Start doxycycline for the abscess.  Continue to do dressing changes and warm compresses.  Counseled patient on potential for adverse effects with medications prescribed/recommended today, ER and return-to-clinic precautions discussed, patient verbalized understanding.    Wallis Bamberg, [place] 03/22/23 1714

## 2023-03-22 NOTE — Discharge Instructions (Signed)
Please change your dressing 3-5 times daily. Do not apply any ointments or creams. Each time you change your dressing, make sure that you are pressing on the wound to get pus to come out.  Try your best to clean the wound with antibacterial soap and warm water. Pat your wound dry and let it air out if possible to make sure it is dry before reapplying another dressing.   Start doxycycline for the infection. Use Tylenol for the pain.

## 2023-03-22 NOTE — Telephone Encounter (Signed)
  Chief Complaint: lump on thigh Symptoms: cyst under skin, redness Frequency: 4 days Pertinent Negatives: Patient denies fever, streaking, injury Disposition: [] ED /[x] Urgent Care (no appt availability in office) / [] Appointment(In office/virtual)/ []  [facility]/ [] Home Care/ [] Refused Recommended Disposition /[] [facility]/ []  Follow-up with PCP Additional Notes: Patient called reporting raised, red lump on R thigh that has scant drainage x 4 days. Patient reports he is concerned about the area because he read on the internet that it may be related to a medication that he is taking. Per protocol, patient to be evaluated within 24 hours. Next available with PCP on 03/24/23. Patient agreeable to urgent care and states he will go to [facility] for eval. Care advice reviewed, patient verbalized understanding. Alerting PCP for review.   Copied from CRM [id number]. Topic: Clinical - Red Word Triage >> Mar 22, 2023  2:16 PM Fonda Kinder J wrote: [facility] that prompted transfer to Nurse Triage: Swollen thigh Reason for Disposition  Looks like a boil, infected sore, deep ulcer or other infected rash  Answer Assessment - Initial Assessment Questions 1. APPEARANCE of SWELLING: "What does it look like?"     Red around area, denies streaking 2. SIZE: "How large is the swelling?" (e.g., inches, cm; or compare to size of pinhead, tip of pen, eraser, coin, pea, grape, ping pong ball)      Approx 1 inch area 3. LOCATION: "Where is the swelling located?"     Inner thigh, right towards the back 4. ONSET: "When did the swelling start?"     4 days 5. COLOR: "What color is it?" "Is there more than one color?"     Red where the lump is 6. PAIN: "Is there any pain?" If Yes, ask: "How bad is the pain?" (e.g., scale 1-10; or mild, moderate, severe)     - NONE (0): no pain   - MILD (1-3): doesn't interfere with normal activities    - MODERATE (4-7): interferes with normal activities or awakens from  sleep    - SEVERE (8-10): excruciating pain, unable to do any normal activities     Denies pain 7. ITCH: "Does it itch?" If Yes, ask: "How bad is the itch?"      Denies 8. CAUSE: "What do you think caused the swelling?" 9 OTHER SYMPTOMS: "Do you have any other symptoms?" (e.g., fever)     Denies  Protocols used: Skin Lump or Localized Swelling-A-AH

## 2023-03-22 NOTE — ED Triage Notes (Signed)
Pt c/o ?abscess to right inner thigh/groin-first noticed yesterday "some white discharge"-NAD-steady gait

## 2023-04-19 DIAGNOSIS — H52223 Regular astigmatism, bilateral: Secondary | ICD-10-CM | POA: Diagnosis not present

## 2023-04-19 LAB — HM DIABETES EYE EXAM

## 2023-04-20 ENCOUNTER — Encounter: Payer: Self-pay | Admitting: Endocrinology

## 2023-04-28 ENCOUNTER — Other Ambulatory Visit: Payer: Self-pay

## 2023-04-28 DIAGNOSIS — E1165 Type 2 diabetes mellitus with hyperglycemia: Secondary | ICD-10-CM

## 2023-05-09 ENCOUNTER — Other Ambulatory Visit: Payer: Medicare HMO

## 2023-05-09 DIAGNOSIS — E1165 Type 2 diabetes mellitus with hyperglycemia: Secondary | ICD-10-CM | POA: Diagnosis not present

## 2023-05-10 ENCOUNTER — Encounter: Payer: Self-pay | Admitting: Endocrinology

## 2023-05-10 LAB — BASIC METABOLIC PANEL
BUN: 20 mg/dL (ref 7–25)
CO2: 28 mmol/L (ref 20–32)
Calcium: 10.3 mg/dL (ref 8.6–10.3)
Chloride: 100 mmol/L (ref 98–110)
Creat: 0.88 mg/dL (ref 0.70–1.28)
Glucose, Bld: 145 mg/dL — ABNORMAL HIGH (ref 65–99)
Potassium: 4.8 mmol/L (ref 3.5–5.3)
Sodium: 139 mmol/L (ref 135–146)

## 2023-05-10 NOTE — Progress Notes (Signed)
 CARDIOLOGY CONSULT NOTE       Patient ID: Steven Wheeler MRN: [id number] DOB/AGE: [birth year] 73 y.o.  Referring Physician: [place] Primary Physician: Margaree Mackintosh, MD Primary Cardiologist: Eden Emms Reason for Consultation: CAD    HPI:  73 y.o. with history of DM and family history of premature CAD with father dying of an MI.. No history of vascular/cardiac disease Had coronary calcium score done 01/28/21 Reviewed Severe LM and 3 vessel calcium total score 2516 which was 95 th percentile for age/sex. Historically his A1c has been poorly controlled ranging from 8.9-> current 7.3.  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 He is very active with no angina   ROS All other systems reviewed and negative except as noted above  Past Medical History:  Diagnosis Date   Diabetes mellitus without complication (HCC)    Diverticulosis    2009 Virtual colonoscopy   Hx of adenomatous polyp of colon 11/2020   6 mm - no routine repeat colonoscopy needed    Family History  Problem Relation Age of Onset   Heart disease Father    Diabetes Father    Obesity Father    Breast cancer Sister    Cancer Sister    Kidney Stones Brother    Colon cancer Neg Hx    Colon polyps Neg Hx    Esophageal cancer Neg Hx    Stomach cancer Neg Hx    Rectal cancer Neg Hx     Social History    Socioeconomic History   Marital status: Widowed    Spouse name: Not on file   Number of children: Not on file   Years of education: Not on file   Highest education level: Not on file  Occupational History   Not on file  Tobacco Use   Smoking status: Never   Smokeless tobacco: Never  Vaping Use   Vaping status: Never Used  Substance and Sexual Activity   Alcohol use: Yes    Comment: rare once a month maybe   Drug use: No   Sexual activity: Not on file  Other Topics Concern   Not on file  Social History Narrative   Not on file   Social Drivers of Health   Financial Resource Strain: Low Risk  (01/07/2023)   Overall Financial Resource Strain (CARDIA)    Difficulty of Paying Living Expenses: Not hard at all  Food Insecurity: No Food Insecurity (01/07/2023)   Hunger Vital Sign    Worried About Running Out of Food in the Last Year: Never true    Ran Out of Food in the Last Year: Never true  Transportation Needs: No Transportation Needs (01/07/2023)   PRAPARE - Administrator, Civil Service (Medical): No    Lack of Transportation (Non-Medical): No  Physical Activity: Sufficiently Active (01/07/2023)   Exercise Vital Sign    Days of Exercise per Week: 7 days    Minutes of Exercise per Session: 60 min  Stress: No Stress Concern Present (01/07/2023)   [employer] of Occupational Health - Occupational Stress Questionnaire    Feeling of Stress : Not at all  Social Connections: Moderately Integrated (01/07/2023)   Social Connection and Isolation Panel [NHANES]    Frequency of Communication with Friends and Family: More than three times a week    Frequency of Social Gatherings with Friends and Family: More than three times a week    Attends Religious Services: More than 4 times per year    Active Member of [employer] or Organizations: Yes    Attends Banker Meetings: More than 4 times per year    Marital Status: Widowed  Intimate Partner Violence: Not  At Risk (01/10/2023)   Humiliation, Afraid, Rape, and Kick questionnaire    Fear of Current or Ex-Partner: No    Emotionally Abused: No    Physically Abused: No    Sexually Abused: No    Past Surgical History:  Procedure Laterality Date   virtual colonoscopy  2009   tics   WISDOM TOOTH EXTRACTION        Current Outpatient Medications:    ARGININE PO, Take by mouth., Disp: , Rfl:    Barberry-Oreg Grape-Goldenseal (BERBERINE COMPLEX PO), Take by mouth., Disp: , Rfl:    Blood Glucose Monitoring Suppl (ONETOUCH VERIO FLEX SYSTEM) w/Device KIT, Use to check blood sugar once daily  Dx code E11.9, Disp: 1 kit, Rfl: 0   Blood Glucose Monitoring Suppl DEVI, 1 each by Does not apply route in the morning, at noon, and at bedtime. May substitute to any manufacturer covered by patient's insurance., Disp: 1 each, Rfl: 0   Cholecalciferol (VITAMIN D PO), Take by mouth., Disp: , Rfl:    CINNAMON PO, Take by mouth., Disp: , Rfl:    co-enzyme Q-10 50 MG capsule, Take 50 mg by mouth daily., Disp: , Rfl:    Dapagliflozin Pro-metFORMIN ER (XIGDUO XR) 06-998 MG TB24, Take 2 tablets by mouth daily., Disp: 180 tablet, Rfl: 3   Glucose Blood (BLOOD GLUCOSE TEST STRIPS) STRP, 1 each by In Vitro route daily. May substitute to any manufacturer covered by patient's insurance., Disp: 100 each, Rfl: 3   KRILL OIL OMEGA-3 PO, Take by mouth., Disp: , Rfl:    Lancet Device MISC, 1 each by Does not apply route in the morning, at noon, and at bedtime. May substitute to any manufacturer covered by patient's insurance., Disp: 1 each, Rfl: 0   Lancets Misc. MISC, 1 each by Does not apply route daily. May substitute to any manufacturer covered by patient's insurance., Disp: 100 each, Rfl: 3   Multiple Vitamin (MULTIVITAMIN) tablet, Take 1 tablet by mouth daily., Disp: , Rfl:    NON FORMULARY, Tumeric, berberine?, Disp: , Rfl:    NON FORMULARY, 1 capsule daily at 2 am. Cholestrol complete, Disp: , Rfl:    OneTouch Delica  Lancets 33G MISC, USE TO CHECK BLOOD SUGAR ONCE A DAY DX CODE E11.9, Disp: 100 each, Rfl: 3   ONETOUCH VERIO test strip, USE AS INSTRUCTED TO CHECK BLOOD SUGAR ONCE A DAY DX CODE E11.9, Disp: 100 strip, Rfl: 2   rosuvastatin (CRESTOR) 20 MG tablet, TAKE ONE TABLET BY MOUTH ONE TIME DAILY, Disp: 90 tablet, Rfl: 3 No current facility-administered medications for this visit.  Facility-Administered  Medications Ordered in Other Visits:    technetium tetrofosmin (TC-MYOVIEW) injection 32.2 millicurie, 32.2 millicurie, Intravenous, Once PRN, Croitoru, Mihai, MD    Physical Exam: Blood pressure 136/74, pulse 60, height 6' (1.829 m), weight 194 lb 9.6 oz (88.3 kg), SpO2 95%.    Affect appropriate Healthy:  appears stated age HEENT: normal Neck supple with no adenopathy JVP normal no bruits no thyromegaly Lungs clear with no wheezing and good diaphragmatic motion Heart:  S1/S2 no murmur, no rub, gallop or click PMI normal Abdomen: benighn, BS positve, no tenderness, no AAA no bruit.  No HSM or HJR Distal pulses intact with no bruits No edema Neuro non-focal Skin warm and dry No muscular weakness   Labs:   Lab Results  Component Value Date   WBC 8.2 01/06/2023   HGB 16.3 01/06/2023   HCT 50.6 (H) 01/06/2023   MCV 88.8 01/06/2023   PLT 256 01/06/2023    No results for input(s): "NA", "K", "CL", "CO2", "BUN", "CREATININE", "CALCIUM", "PROT", "BILITOT", "ALKPHOS", "ALT", "AST", "GLUCOSE" in the last 168 hours.  Invalid input(s): "LABALBU"   No results found for: "CKTOTAL", "CKMB", "CKMBINDEX", "TROPONINI"  Lab Results  Component Value Date   CHOL 118 01/06/2023   CHOL 110 01/05/2022   CHOL 152 01/01/2021   Lab Results  Component Value Date   HDL 53 01/06/2023   HDL 51 01/05/2022   HDL 47 01/01/2021   Lab Results  Component Value Date   LDLCALC 47 01/06/2023   LDLCALC 46 01/05/2022   LDLCALC 91 01/01/2021   Lab Results  Component Value Date   TRIG 92 01/06/2023    TRIG 57 01/05/2022   TRIG 57 01/01/2021   Lab Results  Component Value Date   CHOLHDL 2.2 01/06/2023   CHOLHDL 2.2 01/05/2022   CHOLHDL 3.2 01/01/2021   No results found for: "LDLDIRECT"    Radiology: No results found.  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 Target LDL < 55 Most recent 47 01/06/23   DM:  Discussed low carb diet  target hemoglobin A1c is 6.5 or less.  Continue current medications. Most recent A1c elevated 7/5 01/06/23 F/U Dr Erroll Luna endocrinology  ETT in a year   F/U in a year   Signed: Charlton Haws 05/24/2023, 8:42 AM

## 2023-05-12 ENCOUNTER — Encounter: Payer: Self-pay | Admitting: Endocrinology

## 2023-05-12 ENCOUNTER — Ambulatory Visit: Payer: Medicare HMO | Admitting: Endocrinology

## 2023-05-12 VITALS — BP 130/68 | HR 65 | Resp 16 | Ht 72.0 in | Wt 193.6 lb

## 2023-05-12 DIAGNOSIS — E1165 Type 2 diabetes mellitus with hyperglycemia: Secondary | ICD-10-CM

## 2023-05-12 DIAGNOSIS — Z7984 Long term (current) use of oral hypoglycemic drugs: Secondary | ICD-10-CM | POA: Diagnosis not present

## 2023-05-12 LAB — POCT GLYCOSYLATED HEMOGLOBIN (HGB A1C): Hemoglobin A1C: 6.7 % — AB (ref 4.0–5.6)

## 2023-05-12 MED ORDER — LANCETS MISC. MISC: 1.0000 | Freq: Every day | 3 refills | Status: AC

## 2023-05-12 MED ORDER — ONETOUCH VERIO FLEX SYSTEM W/DEVICE KIT: PACK | 0 refills | Status: DC

## 2023-05-12 MED ORDER — LANCET DEVICE MISC: 1.0000 | Freq: Three times a day (TID) | 0 refills | Status: AC

## 2023-05-12 MED ORDER — BLOOD GLUCOSE MONITORING SUPPL DEVI: 1.0000 | Freq: Three times a day (TID) | 0 refills | Status: AC

## 2023-05-12 MED ORDER — BLOOD GLUCOSE TEST VI STRP: 1.0000 | ORAL_STRIP | Freq: Every day | 3 refills | Status: AC

## 2023-05-12 NOTE — Progress Notes (Signed)
 Outpatient Endocrinology Note Steven Maryfrances Portugal, MD  05/12/23  Patient's Name: Steven Wheeler    DOB: May 05, 1950    MRN: [id number]                                                    REASON OF VISIT: Follow up of type 2 diabetes mellitus  PCP: Margaree Mackintosh, MD  HISTORY OF PRESENT ILLNESS:   Steven Wheeler is a 73 y.o. old male with past medical history listed below, is here for follow up for type 2 diabetes mellitus.    Pertinent Diabetes History: Patient was previously seen by Dr. Lucianne Muss and was last time seen in June 2024. Patient was diagnosed with type 2 diabetes mellitus in 2017.  Hemoglobin A1c was 6.9% at the time of diagnosis.  He has controlled type 2 diabetes mellitus.  Chronic Diabetes Complications : Retinopathy: no. Last ophthalmology exam was done on annually 03/2023, following with ophthalmology regularly.  Nephropathy: no Peripheral neuropathy: no Coronary artery disease: no Stroke: no  Relevant comorbidities and cardiovascular risk factors: Obesity: no Body mass index is 26.26 kg/m.  Hypertension: no  Hyperlipidemia : Yes, on statin   Current / Home Diabetic regimen includes: Xigduo extended release 06/998 mg 2 tablets daily.  Prior diabetic medications:  Glycemic data:   One Touch Verio flex glucometer data from March 6 to May 12, 2023 reviewed average blood sugar 146.  He has been mostly taking in the morning fasting some of the blood sugar 164, 118, 151, 152, 133, 142, 148.  Hypoglycemia: Patient has no hypoglycemic episodes. Patient has hypoglycemia awareness.  Factors modifying glucose control: 1.  Diabetic diet assessment: 3 meals a day.  2.  Staying active or exercising: Regularly walks every day about 3 miles a day.  3.  Medication compliance: compliant all of the time.  Interval history  Hemoglobin A1c today 6.7%, improved.  He reports he had birthday celebration and other events eating more probably had some hyperglycemia.  Denies any  complaints.  Had diabetic eye exam last month with no diabetic retinopathy.  No numbness and tingling of the feet.  No other complaints today.  Renal function is stable with normal creatinine recently.   REVIEW OF SYSTEMS As per history of present illness.   PAST MEDICAL HISTORY: Past Medical History:  Diagnosis Date   Diabetes mellitus without complication (HCC)    Diverticulosis    2009 Virtual colonoscopy   Hx of adenomatous polyp of colon 11/2020   6 mm - no routine repeat colonoscopy needed    PAST SURGICAL HISTORY: Past Surgical History:  Procedure Laterality Date   virtual colonoscopy  2009   tics   WISDOM TOOTH EXTRACTION      ALLERGIES: No Known Allergies  FAMILY HISTORY:  Family History  Problem Relation Age of Onset   Heart disease Father    Diabetes Father    Obesity Father    Breast cancer Sister    Cancer Sister    Kidney Stones Brother    Colon cancer Neg Hx    Colon polyps Neg Hx    Esophageal cancer Neg Hx    Stomach cancer Neg Hx    Rectal cancer Neg Hx     SOCIAL HISTORY: Social History   Socioeconomic History   Marital status: Widowed  Spouse name: Not on file   Number of children: Not on file   Years of education: Not on file   Highest education level: Not on file  Occupational History   Not on file  Tobacco Use   Smoking status: Never   Smokeless tobacco: Never  Vaping Use   Vaping status: Never Used  Substance and Sexual Activity   Alcohol use: Yes    Comment: rare once a month maybe   Drug use: No   Sexual activity: Not on file  Other Topics Concern   Not on file  Social History Narrative   Not on file   Social Drivers of Health   Financial Resource Strain: Low Risk  (01/07/2023)   Overall Financial Resource Strain (CARDIA)    Difficulty of Paying Living Expenses: Not hard at all  Food Insecurity: No Food Insecurity (01/07/2023)   Hunger Vital Sign    Worried About Running Out of Food in the Last Year: Never true     Ran Out of Food in the Last Year: Never true  Transportation Needs: No Transportation Needs (01/07/2023)   PRAPARE - Administrator, Civil Service (Medical): No    Lack of Transportation (Non-Medical): No  Physical Activity: Sufficiently Active (01/07/2023)   Exercise Vital Sign    Days of Exercise per Week: 7 days    Minutes of Exercise per Session: 60 min  Stress: No Stress Concern Present (01/07/2023)   [employer] of Occupational Health - Occupational Stress Questionnaire    Feeling of Stress : Not at all  Social Connections: Moderately Integrated (01/07/2023)   Social Connection and Isolation Panel [NHANES]    Frequency of Communication with Friends and Family: More than three times a week    Frequency of Social Gatherings with Friends and Family: More than three times a week    Attends Religious Services: More than 4 times per year    Active Member of [employer] or Organizations: Yes    Attends Banker Meetings: More than 4 times per year    Marital Status: Widowed    MEDICATIONS:  Current Outpatient Medications  Medication Sig Dispense Refill   ARGININE PO Take by mouth.     Barberry-Oreg Grape-Goldenseal (BERBERINE COMPLEX PO) Take by mouth.     Blood Glucose Monitoring Suppl DEVI 1 each by Does not apply route in the morning, at noon, and at bedtime. May substitute to any manufacturer covered by patient's insurance. 1 each 0   Cholecalciferol (VITAMIN D PO) Take by mouth.     CINNAMON PO Take by mouth.     co-enzyme Q-10 50 MG capsule Take 50 mg by mouth daily.     Dapagliflozin Pro-metFORMIN ER (XIGDUO XR) 06-998 MG TB24 Take 2 tablets by mouth daily. 180 tablet 3   doxycycline (VIBRAMYCIN) 100 MG capsule Take 1 capsule (100 mg total) by mouth 2 (two) times daily. 20 capsule 0   Glucose Blood (BLOOD GLUCOSE TEST STRIPS) STRP 1 each by In Vitro route daily. May substitute to any manufacturer covered by patient's insurance. 100 each 3   KRILL OIL  OMEGA-3 PO Take by mouth.     Lancet Device MISC 1 each by Does not apply route in the morning, at noon, and at bedtime. May substitute to any manufacturer covered by patient's insurance. 1 each 0   Lancets Misc. MISC 1 each by Does not apply route daily. May substitute to any manufacturer covered by patient's insurance. 100 each 3  Multiple Vitamin (MULTIVITAMIN) tablet Take 1 tablet by mouth daily.     NON FORMULARY Tumeric, berberine?     NON FORMULARY 1 capsule daily at 2 am. Cholestrol complete     OneTouch Delica Lancets 33G MISC USE TO CHECK BLOOD SUGAR ONCE A DAY DX CODE E11.9 100 each 3   ONETOUCH VERIO test strip USE AS INSTRUCTED TO CHECK BLOOD SUGAR ONCE A DAY DX CODE E11.9 100 strip 2   rosuvastatin (CRESTOR) 20 MG tablet TAKE ONE TABLET BY MOUTH ONE TIME DAILY 90 tablet 3   Blood Glucose Monitoring Suppl (ONETOUCH VERIO FLEX SYSTEM) w/Device KIT Use to check blood sugar once daily  Dx code E11.9 1 kit 0   No current facility-administered medications for this visit.   Facility-Administered Medications Ordered in Other Visits  Medication Dose Route Frequency Provider Last Rate Last Admin   technetium tetrofosmin (TC-MYOVIEW) injection 32.2 millicurie  32.2 millicurie Intravenous Once PRN Croitoru, Mihai, MD        PHYSICAL EXAM: Vitals:   05/12/23 0850  BP: 130/68  Pulse: 65  Resp: 16  SpO2: 98%  Weight: 193 lb 9.6 oz (87.8 kg)  Height: 6' (1.829 m)   Body mass index is 26.26 kg/m.  Wt Readings from Last 3 Encounters:  05/12/23 193 lb 9.6 oz (87.8 kg)  01/11/23 190 lb 12.8 oz (86.5 kg)  01/10/23 190 lb (86.2 kg)    General: Well developed, well nourished male in no apparent distress.  HEENT: AT/[place], no external lesions.  Eyes: Conjunctiva clear and no icterus. Neck: Neck supple  Lungs: Respirations not labored Neurologic: Alert, oriented, normal speech Extremities / Skin: Dry.  Psychiatric: Does not appear depressed or anxious  Diabetic Foot Exam - Simple   No  data filed    LABS Reviewed Lab Results  Component Value Date   HGBA1C 6.7 (A) 05/12/2023   HGBA1C 7.5 (H) 01/06/2023   HGBA1C 7.0 (H) 08/13/2022   Lab Results  Component Value Date   FRUCTOSAMINE 259 03/16/2022   FRUCTOSAMINE 316 (H) 07/14/2020   Lab Results  Component Value Date   CHOL 118 01/06/2023   HDL 53 01/06/2023   LDLCALC 47 01/06/2023   TRIG 92 01/06/2023   CHOLHDL 2.2 01/06/2023   Lab Results  Component Value Date   MICRALBCREAT 3 01/06/2023   MICRALBCREAT NOTE 01/07/2022   Lab Results  Component Value Date   CREATININE 0.88 05/09/2023   Lab Results  Component Value Date   GFR 80.54 08/13/2022    ASSESSMENT / PLAN  1. Uncontrolled type 2 diabetes mellitus with hyperglycemia, without long-term current use of insulin (HCC)     Diabetes Mellitus type 2, complicated by no known complications. - Diabetic status / severity: Uncontrolled.  Lab Results  Component Value Date   HGBA1C 6.7 (A) 05/12/2023    - Hemoglobin A1c goal : <6.5%  Improvement on diabetes control.  - Medications: No change.  I) continue Xigduo extended release 06/998 mg 2 tablets daily.  - Home glucose testing: Few times a week at different times of the day.  New glucometer and test supplies ordered. - Discussed/ Gave Hypoglycemia treatment plan.  # Consult : not required at this time.   # Annual urine for microalbuminuria/ creatinine ratio, no microalbuminuria currently. Last  Lab Results  Component Value Date   MICRALBCREAT 3 01/06/2023    # Foot check nightly.  # Annual dilated diabetic eye exams.   - Diet: Make healthy diabetic food choices - Life  style / activity / exercise: Discussed.  2. Blood pressure  -  BP Readings from Last 1 Encounters:  05/12/23 130/68    - Control is in target.  - No change in current plans.  3. Lipid status / Hyperlipidemia - Last  Lab Results  Component Value Date   LDLCALC 47 01/06/2023   - Continue rosuvastatin 20 mg  daily.  Steven "Brett Canales" was seen today for medical management of chronic issues.  Diagnoses and all orders for this visit:  Uncontrolled type 2 diabetes mellitus with hyperglycemia, without long-term current use of insulin (HCC) -     POCT glycosylated hemoglobin (Hb A1C) -     Blood Glucose Monitoring Suppl DEVI; 1 each by Does not apply route in the morning, at noon, and at bedtime. May substitute to any manufacturer covered by patient's insurance. -     Glucose Blood (BLOOD GLUCOSE TEST STRIPS) STRP; 1 each by In Vitro route daily. May substitute to any manufacturer covered by patient's insurance. -     Lancet Device MISC; 1 each by Does not apply route in the morning, at noon, and at bedtime. May substitute to any manufacturer covered by patient's insurance. -     Lancets Misc. MISC; 1 each by Does not apply route daily. May substitute to any manufacturer covered by patient's insurance.  Other orders -     Blood Glucose Monitoring Suppl (ONETOUCH VERIO FLEX SYSTEM) w/Device KIT; Use to check blood sugar once daily  Dx code E11.9    DISPOSITION Follow up in clinic in 4  months suggested.   All questions answered and patient verbalized understanding of the plan.  Steven Kymberley Raz, MD [facility] Endocrinology [facility] [address] [place], [address] [place], [place] [address] Phone # [phone]  At least part of this note was generated using voice recognition software. Inadvertent word errors may have occurred, which were not recognized during the proofreading process.

## 2023-05-24 ENCOUNTER — Ambulatory Visit: Payer: Medicare HMO | Attending: Cardiovascular Disease | Admitting: Cardiovascular Disease

## 2023-05-24 ENCOUNTER — Encounter: Payer: Self-pay | Admitting: Cardiovascular Disease

## 2023-05-24 VITALS — BP 136/74 | HR 60 | Ht 72.0 in | Wt 194.6 lb

## 2023-05-24 DIAGNOSIS — I2510 Atherosclerotic heart disease of native coronary artery without angina pectoris: Secondary | ICD-10-CM | POA: Diagnosis not present

## 2023-05-24 DIAGNOSIS — E782 Mixed hyperlipidemia: Secondary | ICD-10-CM

## 2023-05-24 NOTE — Patient Instructions (Addendum)
 Medication Instructions:  Your physician recommends that you continue on your current medications as directed. Please refer to the Current Medication list given to you today.  *If you need a refill on your cardiac medications before your next appointment, please call your pharmacy*  Lab Work: If you have labs (blood work) drawn today and your tests are completely normal, you will receive your results only by: MyChart Message (if you have MyChart) OR A paper copy in the mail If you have any lab test that is abnormal or we need to change your treatment, we will call you to review the results.   Follow-Up: At [facility], you and your health needs are our priority.  As part of our continuing mission to provide you with exceptional heart care, our providers are all part of one team.  This team includes your primary Cardiologist (physician) and Advanced Practice Providers or APPs (Physician Assistants and Nurse Practitioners) who all work together to provide you with the care you need, when you need it.  Your next appointment:   1 year(s)  Provider:   Charlton Haws, MD     We recommend signing up for the patient portal called "MyChart".  Sign up information is provided on this After Visit Summary.  MyChart is used to connect with patients for Virtual Visits (Telemedicine).  Patients are able to view lab/test results, encounter notes, upcoming appointments, etc.  Non-urgent messages can be sent to your provider as well.   To learn more about what you can do with MyChart, go to [email or web address].   Other Instructions      1st Floor: - Lobby - Registration  - Pharmacy  - Lab - Cafe  2nd Floor: - PV Lab - Diagnostic Testing (echo, CT, nuclear med)  3rd Floor: - Vacant  4th Floor: - TCTS (cardiothoracic surgery) - AFib Clinic - Structural Heart Clinic - Vascular Surgery  - Vascular Ultrasound  5th Floor: - HeartCare Cardiology (general and EP) - Clinical  Pharmacy for coumadin, hypertension, lipid, weight-loss medications, and med management appointments    Valet parking services will be available as well.

## 2023-08-02 ENCOUNTER — Other Ambulatory Visit: Payer: Self-pay | Admitting: Internal Medicine

## 2023-08-09 DIAGNOSIS — K080 Exfoliation of teeth due to systemic causes: Secondary | ICD-10-CM | POA: Diagnosis not present

## 2023-08-18 DIAGNOSIS — K080 Exfoliation of teeth due to systemic causes: Secondary | ICD-10-CM | POA: Diagnosis not present

## 2023-09-28 ENCOUNTER — Ambulatory Visit: Payer: Self-pay | Admitting: Endocrinology

## 2023-09-28 ENCOUNTER — Ambulatory Visit: Admitting: Endocrinology

## 2023-09-28 ENCOUNTER — Encounter: Payer: Self-pay | Admitting: Endocrinology

## 2023-09-28 VITALS — BP 126/70 | HR 68 | Resp 20 | Ht 72.0 in | Wt 189.8 lb

## 2023-09-28 DIAGNOSIS — Z7984 Long term (current) use of oral hypoglycemic drugs: Secondary | ICD-10-CM | POA: Diagnosis not present

## 2023-09-28 DIAGNOSIS — E1165 Type 2 diabetes mellitus with hyperglycemia: Secondary | ICD-10-CM | POA: Diagnosis not present

## 2023-09-28 LAB — POCT GLYCOSYLATED HEMOGLOBIN (HGB A1C): Hemoglobin A1C: 6.9 % — AB (ref 4.0–5.6)

## 2023-09-28 MED ORDER — ONETOUCH VERIO VI STRP: 1.0000 | ORAL_STRIP | Freq: Every day | 2 refills | Status: AC

## 2023-09-28 NOTE — Progress Notes (Signed)
 Outpatient Endocrinology Note Steven Derrico Zhong, MD  09/28/23  Patient's Name: Steven Wheeler    DOB: Dec 03, 1950    MRN: [id number]                                                    REASON OF VISIT: Follow up of type 2 diabetes mellitus  PCP: Perri Ronal PARAS, MD  HISTORY OF PRESENT ILLNESS:   Steven Wheeler is a 73 y.o. old male with past medical history listed below, is here for follow up for type 2 diabetes mellitus.    Pertinent Diabetes History: Patient was previously seen by Dr. Von and was last time seen in June 2024. Patient was diagnosed with type 2 diabetes mellitus in 2017.  Hemoglobin A1c was 6.9% at the time of diagnosis.  He has controlled type 2 diabetes mellitus.  Chronic Diabetes Complications : Retinopathy: no. Last ophthalmology exam was done on annually 03/2023, following with ophthalmology regularly.  Nephropathy: no Peripheral neuropathy: no Coronary artery disease: no Stroke: no  Relevant comorbidities and cardiovascular risk factors: Obesity: no Body mass index is 25.74 kg/m.  Hypertension: no  Hyperlipidemia : Yes, on statin   Current / Home Diabetic regimen includes: Xigduo  extended release 06/998 mg 2 tablets daily.  Prior diabetic medications:  Glycemic data:   One Touch Verio flex glucometer data from July 23 to August 6 , 2025 reviewed average blood sugar 147.  He has been mostly checking at different times of the day.  Some of the blood sugars are 152, 153, 117, 127, 157, 150, 151, 107.    Hypoglycemia: Patient has no hypoglycemic episodes. Patient has hypoglycemia awareness.  Factors modifying glucose control: 1.  Diabetic diet assessment: 3 meals a day.  2.  Staying active or exercising: Regularly walks every day about 3 miles a day.  3.  Medication compliance: compliant all of the time.  Interval history  Hemoglobin A1c 6.9%.  He reports he is stretching during the summer causing some high blood sugar.  Denies numbness and tingling  of the feet.  No vision problem.  Glucometer data as reviewed above.  No other complaints today.  REVIEW OF SYSTEMS As per history of present illness.   PAST MEDICAL HISTORY: Past Medical History:  Diagnosis Date   Diabetes mellitus without complication (HCC)    Diverticulosis    2009 Virtual colonoscopy   Hx of adenomatous polyp of colon 11/2020   6 mm - no routine repeat colonoscopy needed    PAST SURGICAL HISTORY: Past Surgical History:  Procedure Laterality Date   virtual colonoscopy  2009   tics   WISDOM TOOTH EXTRACTION      ALLERGIES: No Known Allergies  FAMILY HISTORY:  Family History  Problem Relation Age of Onset   Heart disease Father    Diabetes Father    Obesity Father    Breast cancer Sister    Cancer Sister    Kidney Stones Brother    Colon cancer Neg Hx    Colon polyps Neg Hx    Esophageal cancer Neg Hx    Stomach cancer Neg Hx    Rectal cancer Neg Hx     SOCIAL HISTORY: Social History   Socioeconomic History   Marital status: Widowed    Spouse name: Not on file   Number of children: Not  on file   Years of education: Not on file   Highest education level: Not on file  Occupational History   Not on file  Tobacco Use   Smoking status: Never   Smokeless tobacco: Never  Vaping Use   Vaping status: Never Used  Substance and Sexual Activity   Alcohol use: Not Currently   Drug use: No   Sexual activity: Not on file  Other Topics Concern   Not on file  Social History Narrative   Not on file   Social Drivers of Health   Financial Resource Strain: Low Risk  (01/07/2023)   Overall Financial Resource Strain (CARDIA)    Difficulty of Paying Living Expenses: Not hard at all  Food Insecurity: No Food Insecurity (01/07/2023)   Hunger Vital Sign    Worried About Running Out of Food in the Last Year: Never true    Ran Out of Food in the Last Year: Never true  Transportation Needs: No Transportation Needs (01/07/2023)   PRAPARE -  Administrator, Civil Service (Medical): No    Lack of Transportation (Non-Medical): No  Physical Activity: Sufficiently Active (01/07/2023)   Exercise Vital Sign    Days of Exercise per Week: 7 days    Minutes of Exercise per Session: 60 min  Stress: No Stress Concern Present (01/07/2023)   [employer] of Occupational Health - Occupational Stress Questionnaire    Feeling of Stress : Not at all  Social Connections: Moderately Integrated (01/07/2023)   Social Connection and Isolation Panel    Frequency of Communication with Friends and Family: More than three times a week    Frequency of Social Gatherings with Friends and Family: More than three times a week    Attends Religious Services: More than 4 times per year    Active Member of [employer] or Organizations: Yes    Attends Banker Meetings: More than 4 times per year    Marital Status: Widowed    MEDICATIONS:  Current Outpatient Medications  Medication Sig Dispense Refill   ARGININE PO Take by mouth.     Barberry-Oreg Grape-Goldenseal (BERBERINE COMPLEX PO) Take by mouth.     Blood Glucose Monitoring Suppl (ONETOUCH VERIO FLEX SYSTEM) w/Device KIT Use to check blood sugar once daily  Dx code E11.9 1 kit 0   Blood Glucose Monitoring Suppl DEVI 1 each by Does not apply route in the morning, at noon, and at bedtime. May substitute to any manufacturer covered by patient's insurance. 1 each 0   Cholecalciferol (VITAMIN D PO) Take by mouth.     CINNAMON PO Take by mouth.     co-enzyme Q-10 50 MG capsule Take 50 mg by mouth daily.     Dapagliflozin  Pro-metFORMIN  ER (XIGDUO  XR) 06-998 MG TB24 Take 2 tablets by mouth daily. 180 tablet 3   Glucose Blood (BLOOD GLUCOSE TEST STRIPS) STRP 1 each by In Vitro route daily. May substitute to any manufacturer covered by patient's insurance. 100 each 3   KRILL OIL OMEGA-3 PO Take by mouth.     Lancets Misc. MISC 1 each by Does not apply route daily. May substitute to any  manufacturer covered by patient's insurance. 100 each 3   Multiple Vitamin (MULTIVITAMIN) tablet Take 1 tablet by mouth daily.     NON FORMULARY Tumeric, berberine?     NON FORMULARY 1 capsule daily at 2 am. Cholestrol complete     OneTouch Delica Lancets 33G MISC USE TO CHECK BLOOD SUGAR  ONCE A DAY DX CODE E11.9 100 each 3   rosuvastatin  (CRESTOR ) 20 MG tablet TAKE 1 TABLET BY MOUTH DAILY 90 tablet 3   glucose blood (ONETOUCH VERIO) test strip 1 each by Other route daily. Use as instructed 100 strip 2   No current facility-administered medications for this visit.   Facility-Administered Medications Ordered in Other Visits  Medication Dose Route Frequency Provider Last Rate Last Admin   technetium tetrofosmin  (TC-MYOVIEW ) injection 32.2 millicurie  32.2 millicurie Intravenous Once PRN Croitoru, Mihai, MD        PHYSICAL EXAM: Vitals:   09/28/23 0812  BP: 126/70  Pulse: 68  Resp: 20  SpO2: 99%  Weight: 189 lb 12.8 oz (86.1 kg)  Height: 6' (1.829 m)   Body mass index is 25.74 kg/m.  Wt Readings from Last 3 Encounters:  09/28/23 189 lb 12.8 oz (86.1 kg)  05/24/23 194 lb 9.6 oz (88.3 kg)  05/12/23 193 lb 9.6 oz (87.8 kg)    General: Well developed, well nourished male in no apparent distress.  HEENT: AT/[place], no external lesions.  Eyes: Conjunctiva clear and no icterus. Neck: Neck supple  Lungs: Respirations not labored Neurologic: Alert, oriented, normal speech Extremities / Skin: Dry.  Psychiatric: Does not appear depressed or anxious  Diabetic Foot Exam - Simple   No data filed    LABS Reviewed Lab Results  Component Value Date   HGBA1C 6.9 (A) 09/28/2023   HGBA1C 6.7 (A) 05/12/2023   HGBA1C 7.5 (H) 01/06/2023   Lab Results  Component Value Date   FRUCTOSAMINE 259 03/16/2022   FRUCTOSAMINE 316 (H) 07/14/2020   Lab Results  Component Value Date   CHOL 118 01/06/2023   HDL 53 01/06/2023   LDLCALC 47 01/06/2023   TRIG 92 01/06/2023   CHOLHDL 2.2 01/06/2023    Lab Results  Component Value Date   MICRALBCREAT 3 01/06/2023   MICRALBCREAT NOTE 01/07/2022   Lab Results  Component Value Date   CREATININE 0.88 05/09/2023   Lab Results  Component Value Date   GFR 80.54 08/13/2022    ASSESSMENT / PLAN  1. Uncontrolled type 2 diabetes mellitus with hyperglycemia, without long-term current use of insulin (HCC)     Diabetes Mellitus type 2, complicated by no other known complications. - Diabetic status / severity: Fair control.  Lab Results  Component Value Date   HGBA1C 6.9 (A) 09/28/2023    - Hemoglobin A1c goal : <6.5%  Discussed about limiting carbohydrates with meals.  - Medications: No change.  I) continue Xigduo  extended release 06/998 mg 2 tablets daily.  - Home glucose testing: Few times a week at different times of the day.  - Discussed/ Gave Hypoglycemia treatment plan.  # Consult : not required at this time.   # Annual urine for microalbuminuria/ creatinine ratio, no microalbuminuria currently. Last  Lab Results  Component Value Date   MICRALBCREAT 3 01/06/2023    # Foot check nightly.  # Annual dilated diabetic eye exams.   - Diet: Make healthy diabetic food choices - Life style / activity / exercise: Discussed.  2. Blood pressure  -  BP Readings from Last 1 Encounters:  09/28/23 126/70    - Control is in target.  - No change in current plans.  3. Lipid status / Hyperlipidemia - Last  Lab Results  Component Value Date   LDLCALC 47 01/06/2023   - Continue rosuvastatin  20 mg daily.  Diagnoses and all orders for this visit:  Uncontrolled type  2 diabetes mellitus with hyperglycemia, without long-term current use of insulin (HCC) -     POCT glycosylated hemoglobin (Hb A1C) -     glucose blood (ONETOUCH VERIO) test strip; 1 each by Other route daily. Use as instructed    DISPOSITION Follow up in clinic in 4 - 5  months suggested.  Labs on the same day of the visit.   All questions answered  and patient verbalized understanding of the plan.  Steven Toriann Spadoni, MD [facility] Endocrinology [facility] [address] [place], [address] [place], [place] [address] Phone # [phone]  At least part of this note was generated using voice recognition software. Inadvertent word errors may have occurred, which were not recognized during the proofreading process.

## 2023-11-16 ENCOUNTER — Other Ambulatory Visit: Payer: Self-pay | Admitting: Endocrinology

## 2024-01-05 NOTE — Progress Notes (Addendum)
 Annual Wellness Visit   Patient Care Team: Baxley, Ronal PARAS, MD as PCP - General (Internal Medicine) Delford Maude BROCKS, MD as PCP - Cardiology (Cardiology) Francella Fairy SAILOR, OD (Optometry)  Visit Date: 01/12/24   Chief Complaint  Patient presents with   Annual Exam   Medicare Wellness   Subjective:  Patient: Steven Wheeler, Male DOB: 1951-01-16, 73 y.o. MRN: [id number]  Steven Wheeler is a 73 y.o. Male who presents today for his Annual Wellness Visit. Patient has Diabetes mellitus (HCC); Obesity; Erectile dysfunction; Metabolic syndrome; Borderline hypertension; and Hypertriglyceridemia on their problem list.   He said that he is doing well.   History of Diabetes Mellitus, Type II treated with Xigduo  XR 06-998 mg twice daily. 01/10/2024  HgbA1c 7.1% He said that he hasn't been eating well but he sees his Endocrinologist ,Dr. Mercie in January.   History of Hyperlipidemia treated with Rosuvastatin  20 mg daily.   History of coronary artery disease seen by Dr. Delford. 01/10/2024 Lipid Panel normal.   Past medical history: Wisdom teeth extraction at age 25.  Hepatitis A around 1982.  History of fractured left arm at age 22.  Up until recently did not want to be on statin medication.  History of borderline hypertension and metabolic syndrome.   Labs 01/10/2024 Blood glucose 136, HgbA1c 7.1%, Otherwise WNL.    01/28/2021 Coronary calcium  score 2516.   11/26/2020 Colonoscopy One 6 mm polyp in the transverse colon. Resected and retrieved. Pathology found to be Adenoma. Diverticulosis in the sigmoid colon. External and internal hemorrhoids. The examination was otherwise normal on direct and retroflexion views. No repeat due to age.    Vaccine counseling: Tetanus vaccine due. Shingles vaccine second dose due.     Health Maintenance  Topic Date Due   Zoster Vaccines- Shingrix (2 of 2) 01/28/2021   DTaP/Tdap/Td (3 - Td or Tdap) 09/02/2022   Medicare Annual Wellness (AWV)  01/10/2024    FOOT EXAM  01/10/2024   OPHTHALMOLOGY EXAM  04/18/2024   COVID-19 Vaccine (8 - 2025-26 season) 06/06/2024   HEMOGLOBIN A1C  07/09/2024   Diabetic kidney evaluation - eGFR measurement  01/09/2025   Diabetic kidney evaluation - Urine ACR  01/09/2025   Pneumococcal Vaccine: 50+ Years  Completed   Influenza Vaccine  Completed   Hepatitis C Screening  Completed   Meningococcal B Vaccine  Aged Out   Colonoscopy  Discontinued     Review of Systems  Constitutional:  Negative for fever and malaise/fatigue.  HENT:  Negative for congestion.   Eyes:  Negative for blurred vision.  Respiratory:  Negative for cough and shortness of breath.   Cardiovascular:  Negative for chest pain, palpitations and leg swelling.  Gastrointestinal:  Negative for vomiting.  Musculoskeletal:  Negative for back pain.  Skin:  Negative for rash.  Neurological:  Negative for loss of consciousness and headaches.   Objective:  Vitals: body mass index is 26.41 kg/m. Today's Vitals   01/12/24 1001  Pulse: 64  SpO2: 97%  Weight: 188 lb (85.3 kg)  Height: 5' 10.75 (1.797 m)  PainSc: 0-No pain   Physical Exam Vitals and nursing note reviewed. Exam conducted with a chaperone present.  Constitutional:      General: He is awake. He is not in acute distress.    Appearance: Normal appearance. He is not ill-appearing or toxic-appearing.  HENT:     Head: Normocephalic and atraumatic.     Right Ear: Tympanic membrane, ear canal and external ear  normal.     Left Ear: Tympanic membrane, ear canal and external ear normal.     Mouth/Throat:     Pharynx: Oropharynx is clear.  Eyes:     Extraocular Movements: Extraocular movements intact.     Pupils: Pupils are equal, round, and reactive to light.  Neck:     Thyroid: No thyroid mass, thyromegaly or thyroid tenderness.     Vascular: No carotid bruit.  Cardiovascular:     Rate and Rhythm: Normal rate and regular rhythm. No extrasystoles are present.    Pulses:           Dorsalis pedis pulses are 2+ on the right side and 2+ on the left side.       Posterior tibial pulses are 2+ on the right side and 2+ on the left side.     Heart sounds: Normal heart sounds. No murmur heard.    No friction rub. No gallop.  Pulmonary:     Effort: Pulmonary effort is normal.     Breath sounds: Normal breath sounds. No decreased breath sounds, wheezing, rhonchi or rales.  Chest:     Chest wall: No mass.  Abdominal:     Palpations: Abdomen is soft. There is no hepatomegaly, splenomegaly or mass.     Tenderness: There is no abdominal tenderness.     Hernia: No hernia is present.  Genitourinary:    Prostate: Normal. Not enlarged, not tender and no nodules present.     Rectum: Normal. Guaiac result negative.  Musculoskeletal:     Cervical back: Normal range of motion.     Right lower leg: No edema.     Left lower leg: No edema.  Feet:     Comments: No lesions Lymphadenopathy:     Cervical: No cervical adenopathy.     Upper Body:     Right upper body: No supraclavicular adenopathy.     Left upper body: No supraclavicular adenopathy.  Skin:    General: Skin is warm and dry.  Neurological:     General: No focal deficit present.     Mental Status: He is alert and oriented to person, place, and time. Mental status is at baseline.     Cranial Nerves: Cranial nerves 2-12 are intact.     Sensory: Sensation is intact.     Motor: Motor function is intact.     Coordination: Coordination is intact.     Gait: Gait is intact.     Deep Tendon Reflexes: Reflexes are normal and symmetric.  Psychiatric:        Attention and Perception: Attention normal.        Mood and Affect: Mood normal.        Speech: Speech normal.        Behavior: Behavior normal. Behavior is cooperative.        Thought Content: Thought content normal.        Cognition and Memory: Cognition and memory normal.        Judgment: Judgment normal.     Current Outpatient Medications  Medication Instructions    ARGININE PO Take by mouth.   Barberry-Oreg Grape-Goldenseal (BERBERINE COMPLEX PO) Take by mouth.   Blood Glucose Monitoring Suppl (ONETOUCH VERIO FLEX SYSTEM) w/Device KIT Use to check blood sugar once daily  Dx code E11.9   Blood Glucose Monitoring Suppl DEVI 1 each, Does not apply, 3 times daily, May substitute to any manufacturer covered by patient's insurance.   Cholecalciferol (VITAMIN D PO) Take  by mouth.   CINNAMON PO Take by mouth.   co-enzyme Q-10 50 mg, Daily   Glucose Blood (BLOOD GLUCOSE TEST STRIPS) STRP 1 each, In Vitro, Daily, May substitute to any manufacturer covered by patient's insurance.   glucose blood (ONETOUCH VERIO) test strip 1 each, Other, Daily, Use as instructed   KRILL OIL OMEGA-3 PO Take by mouth.   Lancets Misc. MISC 1 each, Does not apply, Daily, May substitute to any manufacturer covered by patient's insurance.   Multiple Vitamin (MULTIVITAMIN) tablet 1 tablet, Daily   NON FORMULARY Tumeric, berberine?   NON FORMULARY 1 capsule, Daily   OneTouch Delica Lancets 33G MISC USE TO CHECK BLOOD SUGAR ONCE A DAY DX CODE E11.9   rosuvastatin  (CRESTOR ) 20 mg, Oral, Daily   XIGDUO  XR 06-998 MG TB24 2 tablets, Oral, Daily   Past Medical History:  Diagnosis Date   Diabetes mellitus without complication (HCC)    Diverticulosis    2009 Virtual colonoscopy   Hx of adenomatous polyp of colon 11/2020   6 mm - no routine repeat colonoscopy needed   Medical/Surgical History Narrative:  Allergic/Intolerant to: No Known Allergies  Past Surgical History:  Procedure Laterality Date   virtual colonoscopy  2009   tics   WISDOM TOOTH EXTRACTION     Family History  Problem Relation Age of Onset   Heart disease Father    Diabetes Father    Obesity Father    Breast cancer Sister    Cancer Sister    Kidney Stones Brother    Colon cancer Neg Hx    Colon polyps Neg Hx    Esophageal cancer Neg Hx    Stomach cancer Neg Hx    Rectal cancer Neg Hx    Family History  Narrative: Mother died of an MI.  Mother passed away of complications of septicemia in her late 51s with history of dementia, congestive heart failure and vision loss.  1 brother in good health.  3 sisters-1 died of cancer.  2 sisters living- one of them has hypertension and claudication symptoms.    Social history: Daughter has a child and he spends time with them.  He has 2 adult daughters total.  He is a retired engineer, drilling.  Non-smoker.  Social alcohol consumption. His wife passed away of complications of breast cancer in 2020   Most Recent Health Risks Assessment:   Most Recent Social Determinants of Health (Including Hx of Tobacco, Alcohol, and Drug Use) SDOH Screenings   Food Insecurity: No Food Insecurity (01/08/2024)  Housing: Low Risk  (01/08/2024)  Transportation Needs: No Transportation Needs (01/08/2024)  Utilities: Not At Risk (01/07/2023)  Alcohol Screen: Low Risk  (01/08/2024)  Depression (PHQ2-9): Low Risk  (01/12/2024)  Financial Resource Strain: Low Risk  (01/08/2024)  Physical Activity: Sufficiently Active (01/08/2024)  Social Connections: Unknown (01/08/2024)  Stress: No Stress Concern Present (01/08/2024)  Tobacco Use: Low Risk  (09/28/2023)  Health Literacy: Adequate Health Literacy (01/07/2023)   Social History   Tobacco Use   Smoking status: Never   Smokeless tobacco: Never  Vaping Use   Vaping status: Never Used  Substance Use Topics   Alcohol use: Not Currently   Drug use: No   Most Recent Functional Status Assessment:    01/08/2024    9:54 AM  In your present state of health, do you have any difficulty performing the following activities:  Hearing? 0  Vision? 0  Difficulty concentrating or making decisions? 0  Walking or climbing stairs?  0  Dressing or bathing? 0  Doing errands, shopping? 0  Preparing Food and eating ? N  Using the Toilet? N  In the past six months, have you accidently leaked urine? N  Do you have problems with loss  of bowel control? N  Managing your Medications? N  Managing your Finances? N  Housekeeping or managing your Housekeeping? N   Most Recent Fall Risk Assessment:    01/12/2024    9:50 AM  Fall Risk   Falls in the past year? 0  Number falls in past yr: 0  Injury with Fall? 0  Risk for fall due to : No Fall Risks  Follow up Education provided;Falls prevention discussed;Falls evaluation completed   Most Recent Anxiety/Depression Screenings:    01/12/2024    9:57 AM 01/10/2023   10:04 AM  PHQ 2/9 Scores  PHQ - 2 Score 0 0    Most Recent Cognitive Screening:    01/12/2024    9:50 AM  6CIT Screen  What Year? 0 points  What month? 0 points  What time? 0 points  Count back from 20 0 points  Months in reverse 0 points  Repeat phrase 0 points  Total Score 0 points    Results:  Studies Obtained And Personally Reviewed By Me:  01/28/2021 Coronary calcium  score 2516.   11/26/2020 Colonoscopy One 6 mm polyp in the transverse colon. Resected and retrieved. Pathology found to be Adenoma. Diverticulosis in the sigmoid colon. External and internal hemorrhoids. The examination was otherwise normal on direct and retroflexion views. No repeat due to age.   Labs:  CBC w/ Differential Lab Results  Component Value Date   WBC 8.0 01/10/2024   RBC 5.60 01/10/2024   HGB 15.8 01/10/2024   HCT 49.2 01/10/2024   PLT 228 01/10/2024   MCV 87.9 01/10/2024   MCH 28.2 01/10/2024   MCHC 32.1 01/10/2024   RDW 11.8 01/10/2024   MPV 10.2 01/10/2024   LYMPHSABS 2,105 01/05/2022   MONOABS 682 10/28/2015   BASOSABS 72 01/10/2024    Comprehensive Metabolic Panel Lab Results  Component Value Date   NA 140 01/10/2024   K 5.1 01/10/2024   CL 104 01/10/2024   CO2 24 01/10/2024   GLUCOSE 136 (H) 01/10/2024   BUN 25 01/10/2024   CREATININE 0.93 01/10/2024   CALCIUM  10.0 01/10/2024   PROT 7.1 01/10/2024   ALBUMIN 4.3 10/28/2015   AST 24 01/10/2024   ALT 16 01/10/2024   ALKPHOS 60  10/28/2015   BILITOT 0.7 01/10/2024   GFR 80.54 08/13/2022   EGFR 86 01/10/2024   GFRNONAA 81 04/15/2020   Lipid Panel  Lab Results  Component Value Date   CHOL 124 01/10/2024   HDL 62 01/10/2024   LDLCALC 48 01/10/2024   TRIG 60 01/10/2024   A1c Lab Results  Component Value Date   HGBA1C 7.1 (H) 01/10/2024    01/10/2024 PSA 0.79  Assessment & Plan:    Diabetes Mellitus, Type II: treated with Xigduo  XR 06-998 mg twice daily. 01/10/2024  HgbA1c 7.1% He said that he hasn't been eating well but he sees his endocrinologist Dr. Mercie in January.   Hyperlipidemia: treated with Rosuvastatin  20 mg daily. History of coronary artery disease and followed by Dr. Delford. 01/10/2024 Lipid Panel normal.   01/28/2021 Coronary calcium  score 2516.   11/26/2020 Colonoscopy One 6 mm polyp in the transverse colon. Resected and retrieved. Pathology found to be Adenoma. Diverticulosis in the sigmoid colon. External and internal hemorrhoids.  The examination was otherwise normal on direct and retroflexion views. No repeat due to age.    Vaccine counseling: Tetanus vaccine due. Shingles vaccine second dose due.    Abdominal bruit- no aortic aneurysm found on abdominal ultrasound Nov 2024.  High coronary calcium  score seen by Cardiology and treated with statin  Adenomatous colon polyp 6mm found on 2022 colonoscopy- no need to repeat study at his age per Dr. Avram    Annual Wellness Visit done today including the all of the following: Reviewed patient's Family Medical History Reviewed patient's SDOH and reviewed tobacco, alcohol, and drug use.  Reviewed and updated list of patient's medical providers Assessment of cognitive impairment was done Assessed patient's functional ability Established a written schedule for health screening services Health Risk Assessent Completed and Reviewed  Discussed health benefits of physical activity, and encouraged him to engage in regular exercise appropriate  for his age and condition.    I,Makayla C Reid,acting as a scribe for Ronal JINNY Hailstone, MD.,have documented all relevant documentation on the behalf of Ronal JINNY Hailstone, MD,as directed by  Ronal JINNY Hailstone, MD while in the presence of Ronal JINNY Hailstone, MD.  I, Ronal JINNY Hailstone, MD, have reviewed all documentation for and agree with the above Annual Wellness Visit documentation.  Ronal JINNY Hailstone, MD Internal Medicine 01/12/2024

## 2024-01-09 ENCOUNTER — Telehealth: Payer: Self-pay

## 2024-01-09 DIAGNOSIS — E1165 Type 2 diabetes mellitus with hyperglycemia: Secondary | ICD-10-CM

## 2024-01-09 MED ORDER — ONETOUCH VERIO FLEX SYSTEM W/DEVICE KIT: PACK | 0 refills | Status: AC

## 2024-01-09 NOTE — Telephone Encounter (Signed)
 Patient called requesting a new glucometer. States his current one is no longer working.

## 2024-01-10 ENCOUNTER — Other Ambulatory Visit: Payer: Medicare HMO

## 2024-01-10 DIAGNOSIS — E119 Type 2 diabetes mellitus without complications: Secondary | ICD-10-CM | POA: Diagnosis not present

## 2024-01-10 DIAGNOSIS — Z125 Encounter for screening for malignant neoplasm of prostate: Secondary | ICD-10-CM | POA: Diagnosis not present

## 2024-01-10 DIAGNOSIS — E781 Pure hyperglyceridemia: Secondary | ICD-10-CM

## 2024-01-10 DIAGNOSIS — Z0000 Encounter for general adult medical examination without abnormal findings: Secondary | ICD-10-CM

## 2024-01-11 LAB — COMPREHENSIVE METABOLIC PANEL WITH GFR
AG Ratio: 2 (calc) (ref 1.0–2.5)
ALT: 16 U/L (ref 9–46)
AST: 24 U/L (ref 10–35)
Albumin: 4.7 g/dL (ref 3.6–5.1)
Alkaline phosphatase (APISO): 51 U/L (ref 35–144)
BUN: 25 mg/dL (ref 7–25)
CO2: 24 mmol/L (ref 20–32)
Calcium: 10 mg/dL (ref 8.6–10.3)
Chloride: 104 mmol/L (ref 98–110)
Creat: 0.93 mg/dL (ref 0.70–1.28)
Globulin: 2.4 g/dL (ref 1.9–3.7)
Glucose, Bld: 136 mg/dL — ABNORMAL HIGH (ref 65–99)
Potassium: 5.1 mmol/L (ref 3.5–5.3)
Sodium: 140 mmol/L (ref 135–146)
Total Bilirubin: 0.7 mg/dL (ref 0.2–1.2)
Total Protein: 7.1 g/dL (ref 6.1–8.1)
eGFR: 86 mL/min/1.73m2 (ref >=60)

## 2024-01-11 LAB — CBC WITH DIFFERENTIAL/PLATELET
Absolute Lymphocytes: 2168 {cells}/uL (ref 850–3900)
Absolute Monocytes: 720 {cells}/uL (ref 200–950)
Basophils Absolute: 72 {cells}/uL (ref 0–200)
Basophils Relative: 0.9 %
Eosinophils Absolute: 232 {cells}/uL (ref 15–500)
Eosinophils Relative: 2.9 %
HCT: 49.2 % (ref 38.5–50.0)
Hemoglobin: 15.8 g/dL (ref 13.2–17.1)
MCH: 28.2 pg (ref 27.0–33.0)
MCHC: 32.1 g/dL (ref 32.0–36.0)
MCV: 87.9 fL (ref 80.0–100.0)
MPV: 10.2 fL (ref 7.5–12.5)
Monocytes Relative: 9 %
Neutro Abs: 4808 {cells}/uL (ref 1500–7800)
Neutrophils Relative %: 60.1 %
Platelets: 228 Thousand/uL (ref 140–400)
RBC: 5.6 Million/uL (ref 4.20–5.80)
RDW: 11.8 % (ref 11.0–15.0)
Total Lymphocyte: 27.1 %
WBC: 8 Thousand/uL (ref 3.8–10.8)

## 2024-01-11 LAB — HEMOGLOBIN A1C
Hgb A1c MFr Bld: 7.1 % — ABNORMAL HIGH (ref <5.7)
Mean Plasma Glucose: 157 mg/dL
eAG (mmol/L): 8.7 mmol/L

## 2024-01-11 LAB — LIPID PANEL
Cholesterol: 124 mg/dL (ref <200)
HDL: 62 mg/dL (ref >=40)
LDL Cholesterol (Calc): 48 mg/dL
Non-HDL Cholesterol (Calc): 62 mg/dL (ref <130)
Total CHOL/HDL Ratio: 2 (calc) (ref <5.0)
Triglycerides: 60 mg/dL (ref <150)

## 2024-01-11 LAB — PSA: PSA: 0.79 ng/mL (ref <=4.00)

## 2024-01-11 LAB — MICROALBUMIN / CREATININE URINE RATIO
Creatinine, Urine: 86 mg/dL (ref 20–320)
Microalb Creat Ratio: 2 mg/g{creat} (ref <30)
Microalb, Ur: 0.2 mg/dL

## 2024-01-12 ENCOUNTER — Encounter: Payer: Self-pay | Admitting: Internal Medicine

## 2024-01-12 ENCOUNTER — Ambulatory Visit (INDEPENDENT_AMBULATORY_CARE_PROVIDER_SITE_OTHER): Payer: Medicare HMO | Admitting: Internal Medicine

## 2024-01-12 VITALS — HR 64 | Ht 70.75 in | Wt 188.0 lb

## 2024-01-12 DIAGNOSIS — Z7984 Long term (current) use of oral hypoglycemic drugs: Secondary | ICD-10-CM | POA: Diagnosis not present

## 2024-01-12 DIAGNOSIS — Z0000 Encounter for general adult medical examination without abnormal findings: Secondary | ICD-10-CM | POA: Diagnosis not present

## 2024-01-12 DIAGNOSIS — Z8249 Family history of ischemic heart disease and other diseases of the circulatory system: Secondary | ICD-10-CM

## 2024-01-12 DIAGNOSIS — E785 Hyperlipidemia, unspecified: Secondary | ICD-10-CM | POA: Diagnosis not present

## 2024-01-12 DIAGNOSIS — E119 Type 2 diabetes mellitus without complications: Secondary | ICD-10-CM

## 2024-01-12 DIAGNOSIS — Z860101 Personal history of adenomatous and serrated colon polyps: Secondary | ICD-10-CM

## 2024-01-12 DIAGNOSIS — Z1211 Encounter for screening for malignant neoplasm of colon: Secondary | ICD-10-CM | POA: Diagnosis not present

## 2024-01-12 DIAGNOSIS — R0989 Other specified symptoms and signs involving the circulatory and respiratory systems: Secondary | ICD-10-CM

## 2024-01-12 DIAGNOSIS — R931 Abnormal findings on diagnostic imaging of heart and coronary circulation: Secondary | ICD-10-CM

## 2024-01-12 DIAGNOSIS — E781 Pure hyperglyceridemia: Secondary | ICD-10-CM

## 2024-01-12 LAB — POCT URINALYSIS DIP (CLINITEK)
Bilirubin, UA: NEGATIVE
Blood, UA: NEGATIVE
Glucose, UA: 250 mg/dL — AB
Ketones, POC UA: NEGATIVE mg/dL
Leukocytes, UA: NEGATIVE
Nitrite, UA: NEGATIVE
POC PROTEIN,UA: NEGATIVE
Spec Grav, UA: 1.015 (ref 1.010–1.025)
Urobilinogen, UA: 0.2 U/dL
pH, UA: 6 (ref 5.0–8.0)

## 2024-01-12 LAB — HEMOCCULT GUIAC POC 1CARD (OFFICE): Fecal Occult Blood, POC: NEGATIVE

## 2024-01-12 NOTE — Progress Notes (Signed)
 Chief Complaint  Patient presents with   Annual Exam   Medicare Wellness     Subjective:   Steven Wheeler is a 73 y.o. male who presents for a Medicare Annual Wellness Visit.  Allergies (verified) Patient has no known allergies.   History: Past Medical History:  Diagnosis Date   Diabetes mellitus without complication (HCC)    Diverticulosis    2009 Virtual colonoscopy   Hx of adenomatous polyp of colon 11/2020   6 mm - no routine repeat colonoscopy needed   Past Surgical History:  Procedure Laterality Date   virtual colonoscopy  2009   tics   WISDOM TOOTH EXTRACTION     Family History  Problem Relation Age of Onset   Heart disease Father    Diabetes Father    Obesity Father    Breast cancer Sister    Cancer Sister    Kidney Stones Brother    Colon cancer Neg Hx    Colon polyps Neg Hx    Esophageal cancer Neg Hx    Stomach cancer Neg Hx    Rectal cancer Neg Hx    Social History   Occupational History   Not on file  Tobacco Use   Smoking status: Never   Smokeless tobacco: Never  Vaping Use   Vaping status: Never Used  Substance and Sexual Activity   Alcohol use: Not Currently   Drug use: No   Sexual activity: Not on file   Tobacco Counseling Counseling given: No  SDOH Screenings   Food Insecurity: No Food Insecurity (01/12/2024)  Housing: Low Risk  (01/12/2024)  Transportation Needs: No Transportation Needs (01/12/2024)  Utilities: Not At Risk (01/12/2024)  Alcohol Screen: Low Risk  (01/12/2024)  Depression (PHQ2-9): Low Risk  (01/12/2024)  Financial Resource Strain: Low Risk  (01/12/2024)  Physical Activity: Sufficiently Active (01/12/2024)  Social Connections: Moderately Integrated (01/12/2024)  Stress: No Stress Concern Present (01/12/2024)  Tobacco Use: Low Risk  (01/12/2024)  Health Literacy: Adequate Health Literacy (01/12/2024)   See flowsheets for full screening details  Depression Screen PHQ 2 & 9 Depression Scale- Over the  past 2 weeks, how often have you been bothered by any of the following problems? Little interest or pleasure in doing things: 0 Feeling down, depressed, or hopeless (PHQ Adolescent also includes...irritable): 0 PHQ-2 Total Score: 0     Goals Addressed   None    Visit info / Clinical Intake: Medicare Wellness Visit Type:: Subsequent Annual Wellness Visit Persons participating in visit:: patient Medicare Wellness Visit Mode:: In-person (required for WTM) Information given by:: patient Interpreter Needed?: No Pre-visit prep was completed: yes AWV questionnaire completed by patient prior to visit?: yes Date:: 01/09/24 Living arrangements:: lives with spouse/significant other Patient's Overall Health Status Rating: excellent Typical amount of pain: none Does pain affect daily life?: no Are you currently prescribed opioids?: no  Dietary Habits and Nutritional Risks How many meals a day?: 3 Eats fruit and vegetables daily?: yes Most meals are obtained by: preparing own meals; eating out In the last 2 weeks, have you had any of the following?: none Diabetic:: (!) yes Any non-healing wounds?: no How often do you check your BS?: 2 Would you like to be referred to a Nutritionist or for Diabetic Management? : no  Functional Status Activities of Daily Living (to include ambulation/medication): Independent Ambulation: Independent Medication Administration: Independent Home Management: Independent Manage your own finances?: yes Primary transportation is: driving Concerns about vision?: no *vision screening is required for WTM*  Concerns about hearing?: no  Fall Screening Falls in the past year?: 0 Number of falls in past year: 0 Was there an injury with Fall?: 0 Fall Risk Category Calculator: 0 Patient Fall Risk Level: Low Fall Risk  Fall Risk Patient at Risk for Falls Due to: No Fall Risks Fall risk Follow up: Education provided; Falls prevention discussed; Falls evaluation  completed  Home and Transportation Safety: All rugs have non-skid backing?: yes All stairs or steps have railings?: yes Grab bars in the bathtub or shower?: yes Have non-skid surface in bathtub or shower?: yes Good home lighting?: yes Regular seat belt use?: yes Hospital stays in the last year:: no  Cognitive Assessment Difficulty concentrating, remembering, or making decisions? : no Will 6CIT or Mini Cog be Completed: yes What year is it?: 0 points What month is it?: 0 points About what time is it?: 0 points Count backwards from 20 to 1: 0 points Say the months of the year in reverse: 0 points Repeat the address phrase from earlier: 0 points 6 CIT Score: 0 points  Advance Directives (For Healthcare) Does Patient Have a Medical Advance Directive?: Yes Does patient want to make changes to medical advance directive?: No - Patient declined Type of Advance Directive: Living will; Healthcare Power of Attorney Copy of Healthcare Power of Attorney in Chart?: No - copy requested Copy of Living Will in Chart?: No - copy requested Would patient like information on creating a medical advance directive?: No - Patient declined  Reviewed/Updated  Reviewed/Updated: Reviewed All (Medical, Surgical, Family, Medications, Allergies, Care Teams, Patient Goals)        Objective:    Today's Vitals   01/12/24 1001  Pulse: 64  SpO2: 97%  Weight: 188 lb (85.3 kg)  Height: 5' 10.75 (1.797 m)  PainSc: 0-No pain   Body mass index is 26.41 kg/m.  Current Medications (verified) Outpatient Encounter Medications as of 01/12/2024  Medication Sig   ARGININE PO Take by mouth.   Barberry-Oreg Grape-Goldenseal (BERBERINE COMPLEX PO) Take by mouth.   Blood Glucose Monitoring Suppl (ONETOUCH VERIO FLEX SYSTEM) w/Device KIT Use to check blood sugar once daily  Dx code E11.9   Blood Glucose Monitoring Suppl DEVI 1 each by Does not apply route in the morning, at noon, and at bedtime. May substitute to  any manufacturer covered by patient's insurance.   Cholecalciferol (VITAMIN D PO) Take by mouth.   CINNAMON PO Take by mouth.   co-enzyme Q-10 50 MG capsule Take 50 mg by mouth daily.   Glucose Blood (BLOOD GLUCOSE TEST STRIPS) STRP 1 each by In Vitro route daily. May substitute to any manufacturer covered by patient's insurance.   glucose blood (ONETOUCH VERIO) test strip 1 each by Other route daily. Use as instructed   KRILL OIL OMEGA-3 PO Take by mouth.   Lancets Misc. MISC 1 each by Does not apply route daily. May substitute to any manufacturer covered by patient's insurance.   Multiple Vitamin (MULTIVITAMIN) tablet Take 1 tablet by mouth daily.   NON FORMULARY Tumeric, berberine?   NON FORMULARY 1 capsule daily at 2 am. Cholestrol complete   OneTouch Delica Lancets 33G MISC USE TO CHECK BLOOD SUGAR ONCE A DAY DX CODE E11.9   rosuvastatin  (CRESTOR ) 20 MG tablet TAKE 1 TABLET BY MOUTH DAILY   XIGDUO  XR 06-998 MG TB24 TAKE 2 TABLETS BY MOUTH DAILY   Facility-Administered Encounter Medications as of 01/12/2024  Medication   technetium tetrofosmin  (TC-MYOVIEW ) injection 32.2 millicurie  Hearing/Vision screen No results found. Immunizations and Health Maintenance Health Maintenance  Topic Date Due   Zoster Vaccines- Shingrix (2 of 2) 01/28/2021   DTaP/Tdap/Td (3 - Td or Tdap) 09/02/2022   OPHTHALMOLOGY EXAM  04/18/2024   COVID-19 Vaccine (8 - 2025-26 season) 06/06/2024   HEMOGLOBIN A1C  07/09/2024   Diabetic kidney evaluation - eGFR measurement  01/09/2025   Diabetic kidney evaluation - Urine ACR  01/09/2025   FOOT EXAM  01/11/2025   Medicare Annual Wellness (AWV)  01/11/2025   Pneumococcal Vaccine: 50+ Years  Completed   Influenza Vaccine  Completed   Hepatitis C Screening  Completed   Meningococcal B Vaccine  Aged Out   Colonoscopy  Discontinued        Assessment/Plan:  This is a routine wellness examination for Tiara.  Patient Care Team: Perri Ronal PARAS, MD as PCP -  General (Internal Medicine) Delford Maude BROCKS, MD as PCP - Cardiology (Cardiology) Francella Fairy SAILOR, OD (Optometry)  I have personally reviewed and noted the following in the patient's chart:   Medical and social history Use of alcohol, tobacco or illicit drugs  Current medications and supplements including opioid prescriptions. Functional ability and status Nutritional status Physical activity Advanced directives List of other physicians Hospitalizations, surgeries, and ER visits in previous 12 months Vitals Screenings to include cognitive, depression, and falls Referrals and appointments  Orders Placed This Encounter  Procedures   POCT URINALYSIS DIP (CLINITEK)   Hemoccult - 1 Card (office)   In addition, I have reviewed and discussed with patient certain preventive protocols, quality metrics, and best practice recommendations. A written personalized care plan for preventive services as well as general preventive health recommendations were provided to patient.   Lorah Kalina Zelda, CMA   01/12/2024   Return in about 1 year (around 01/11/2025).  After Visit Summary: (In Person-Printed) AVS printed and given to the patient  I, Ronal PARAS Perri, MD, have reviewed all documentation for this visit. The documentation on 01/12/2024 for the exam, diagnosis, procedures, and orders are all accurate and complete.

## 2024-01-12 NOTE — Patient Instructions (Addendum)
 Labs reviewed and are stable. Watch diet a bit. Has follow up with Endocrinologist in January. Vaccines discussed and to be obtained at pharmacy. Does not need repeat colonoscopy per Dr. Avram.Return in one year or as needed.  Steven Wheeler,  Thank you for taking the time for your Medicare Wellness Visit. I appreciate your continued commitment to your health goals. Please review the care plan we discussed, and feel free to reach out if I can assist you further.  Please note that Annual Wellness Visits do not include a physical exam. Some assessments may be limited, especially if the visit was conducted virtually. If needed, we may recommend an in-person follow-up with your provider.  Ongoing Care Seeing your primary care provider every 3 to 6 months helps us  monitor your health and provide consistent, personalized care.   Referrals If a referral was made during today's visit and you haven't received any updates within two weeks, please contact the referred provider directly to check on the status.  Recommended Screenings:  Health Maintenance  Topic Date Due   Zoster (Shingles) Vaccine (2 of 2) 01/28/2021   DTaP/Tdap/Td vaccine (3 - Td or Tdap) 09/02/2022   Eye exam for diabetics  04/18/2024   COVID-19 Vaccine (8 - 2025-26 season) 06/06/2024   Hemoglobin A1C  07/09/2024   Yearly kidney function blood test for diabetes  01/09/2025   Yearly kidney health urinalysis for diabetes  01/09/2025   Complete foot exam   01/11/2025   Medicare Annual Wellness Visit  01/11/2025   Pneumococcal Vaccine for age over 73  Completed   Flu Shot  Completed   Hepatitis C Screening  Completed   Meningitis B Vaccine  Aged Out   Colon Cancer Screening  Discontinued       01/12/2024    9:50 AM  Advanced Directives  Does Patient Have a Medical Advance Directive? Yes  Type of Advance Directive Living will;Healthcare Power of Attorney  Does patient want to make changes to medical advance directive? No -  Patient declined  Copy of Healthcare Power of Attorney in Chart? No - copy requested  Would patient like information on creating a medical advance directive? No - Patient declined    Vision: Annual vision screenings are recommended for early detection of glaucoma, cataracts, and diabetic retinopathy. These exams can also reveal signs of chronic conditions such as diabetes and high blood pressure.  Dental: Annual dental screenings help detect early signs of oral cancer, gum disease, and other conditions linked to overall health, including heart disease and diabetes.  Please see the attached documents for additional preventive care recommendations.     Next appointment: Follow up in one year for your annual wellness visit.   Preventive Care 34 Years and Older, Male Preventive care refers to lifestyle choices and visits with your health care provider that can promote health and wellness. What does preventive care include? A yearly physical exam. This is also called an annual well check. Dental exams once or twice a year. Routine eye exams. Ask your health care provider how often you should have your eyes checked. Personal lifestyle choices, including: Daily care of your teeth and gums. Regular physical activity. Eating a healthy diet. Avoiding tobacco and drug use. Limiting alcohol use. Practicing safe sex. Taking low doses of aspirin every day. Taking vitamin and mineral supplements as recommended by your health care provider. What happens during an annual well check? The services and screenings done by your health care provider during your annual well  check will depend on your age, overall health, lifestyle risk factors, and family history of disease. Counseling  Your health care provider may ask you questions about your: Alcohol use. Tobacco use. Drug use. Emotional well-being. Home and relationship well-being. Sexual activity. Eating habits. History of falls. Memory and  ability to understand (cognition). Work and work astronomer. Screening  You may have the following tests or measurements: Height, weight, and BMI. Blood pressure. Lipid and cholesterol levels. These may be checked every 5 years, or more frequently if you are over 47 years old. Skin check. Lung cancer screening. You may have this screening every year starting at age 70 if you have a 30-pack-year history of smoking and currently smoke or have quit within the past 15 years. Fecal occult blood test (FOBT) of the stool. You may have this test every year starting at age 69. Flexible sigmoidoscopy or colonoscopy. You may have a sigmoidoscopy every 5 years or a colonoscopy every 10 years starting at age 72. Prostate cancer screening. Recommendations will vary depending on your family history and other risks. Hepatitis C blood test. Hepatitis B blood test. Sexually transmitted disease (STD) testing. Diabetes screening. This is done by checking your blood sugar (glucose) after you have not eaten for a while (fasting). You may have this done every 1-3 years. Abdominal aortic aneurysm (AAA) screening. You may need this if you are a current or former smoker. Osteoporosis. You may be screened starting at age 15 if you are at high risk. Talk with your health care provider about your test results, treatment options, and if necessary, the need for more tests. Vaccines  Your health care provider may recommend certain vaccines, such as: Influenza vaccine. This is recommended every year. Tetanus, diphtheria, and acellular pertussis (Tdap, Td) vaccine. You may need a Td booster every 10 years. Zoster vaccine. You may need this after age 35. Pneumococcal 13-valent conjugate (PCV13) vaccine. One dose is recommended after age 20. Pneumococcal polysaccharide (PPSV23) vaccine. One dose is recommended after age 36. Talk to your health care provider about which screenings and vaccines you need and how often you need  them. This information is not intended to replace advice given to you by your health care provider. Make sure you discuss any questions you have with your health care provider. Document Released: 03/07/2015 Document Revised: 10/29/2015 Document Reviewed: 12/10/2014 Elsevier Interactive Patient Education  2017 [employer].  Fall Prevention in the Home Falls can cause injuries. They can happen to people of all ages. There are many things you can do to make your home safe and to help prevent falls. What can I do on the outside of my home? Regularly fix the edges of walkways and driveways and fix any cracks. Remove anything that might make you trip as you walk through a door, such as a raised step or threshold. Trim any bushes or trees on the path to your home. Use bright outdoor lighting. Clear any walking paths of anything that might make someone trip, such as rocks or tools. Regularly check to see if handrails are loose or broken. Make sure that both sides of any steps have handrails. Any raised decks and porches should have guardrails on the edges. Have any leaves, snow, or ice cleared regularly. Use sand or salt on walking paths during winter. Clean up any spills in your garage right away. This includes oil or grease spills. What can I do in the bathroom? Use night lights. Install grab bars by the toilet and  in the tub and shower. Do not use towel bars as grab bars. Use non-skid mats or decals in the tub or shower. If you need to sit down in the shower, use a plastic, non-slip stool. Keep the floor dry. Clean up any water that spills on the floor as soon as it happens. Remove soap buildup in the tub or shower regularly. Attach bath mats securely with double-sided non-slip rug tape. Do not have throw rugs and other things on the floor that can make you trip. What can I do in the bedroom? Use night lights. Make sure that you have a light by your bed that is easy to reach. Do not use  any sheets or blankets that are too big for your bed. They should not hang down onto the floor. Have a firm chair that has side arms. You can use this for support while you get dressed. Do not have throw rugs and other things on the floor that can make you trip. What can I do in the kitchen? Clean up any spills right away. Avoid walking on wet floors. Keep items that you use a lot in easy-to-reach places. If you need to reach something above you, use a strong step stool that has a grab bar. Keep electrical cords out of the way. Do not use floor polish or wax that makes floors slippery. If you must use wax, use non-skid floor wax. Do not have throw rugs and other things on the floor that can make you trip. What can I do with my stairs? Do not leave any items on the stairs. Make sure that there are handrails on both sides of the stairs and use them. Fix handrails that are broken or loose. Make sure that handrails are as long as the stairways. Check any carpeting to make sure that it is firmly attached to the stairs. Fix any carpet that is loose or worn. Avoid having throw rugs at the top or bottom of the stairs. If you do have throw rugs, attach them to the floor with carpet tape. Make sure that you have a light switch at the top of the stairs and the bottom of the stairs. If you do not have them, ask someone to add them for you. What else can I do to help prevent falls? Wear shoes that: Do not have high heels. Have rubber bottoms. Are comfortable and fit you well. Are closed at the toe. Do not wear sandals. If you use a stepladder: Make sure that it is fully opened. Do not climb a closed stepladder. Make sure that both sides of the stepladder are locked into place. Ask someone to hold it for you, if possible. Clearly mark and make sure that you can see: Any grab bars or handrails. First and last steps. Where the edge of each step is. Use tools that help you move around (mobility aids)  if they are needed. These include: Canes. Walkers. Scooters. Crutches. Turn on the lights when you go into a dark area. Replace any light bulbs as soon as they burn out. Set up your furniture so you have a clear path. Avoid moving your furniture around. If any of your floors are uneven, fix them. If there are any pets around you, be aware of where they are. Review your medicines with your doctor. Some medicines can make you feel dizzy. This can increase your chance of falling. Ask your doctor what other things that you can do to help prevent falls. This  information is not intended to replace advice given to you by your health care provider. Make sure you discuss any questions you have with your health care provider. Document Released: 12/05/2008 Document Revised: 07/17/2015 Document Reviewed: 03/15/2014 Elsevier Interactive Patient Education  2017 [employer].

## 2024-02-27 ENCOUNTER — Ambulatory Visit: Admitting: Endocrinology

## 2024-02-27 ENCOUNTER — Encounter: Payer: Self-pay | Admitting: Endocrinology

## 2024-02-27 VITALS — BP 132/72 | HR 63 | Resp 16 | Ht 70.0 in | Wt 192.6 lb

## 2024-02-27 DIAGNOSIS — E1165 Type 2 diabetes mellitus with hyperglycemia: Secondary | ICD-10-CM

## 2024-02-27 DIAGNOSIS — Z7984 Long term (current) use of oral hypoglycemic drugs: Secondary | ICD-10-CM | POA: Diagnosis not present

## 2024-02-27 MED ORDER — DAPAGLIFLOZIN PRO-METFORMIN ER 5-1000 MG PO TB24: 1.0000 | ORAL_TABLET | Freq: Two times a day (BID) | ORAL | 3 refills | Status: AC

## 2024-02-27 NOTE — Progress Notes (Signed)
 "  Outpatient Endocrinology Note Alandis Bluemel, MD  02/27/2024  Patient's Name: Steven Wheeler    DOB: 05/06/1950    MRN: [id number]                                                    REASON OF VISIT: Follow up of type 2 diabetes mellitus  PCP: Perri Ronal PARAS, MD  HISTORY OF PRESENT ILLNESS:   MARV ALFREY is a 74 y.o. old male with past medical history listed below, is here for follow up for type 2 diabetes mellitus.    Pertinent Diabetes History: Patient was previously seen by Dr. Von and was last time seen in June 2024. Patient was diagnosed with type 2 diabetes mellitus in 2017.  Hemoglobin A1c was 6.9% at the time of diagnosis.  He has controlled type 2 diabetes mellitus.  Chronic Diabetes Complications : Retinopathy: no. Last ophthalmology exam was done on annually 03/2023, following with ophthalmology regularly.  Nephropathy: no Peripheral neuropathy: no Coronary artery disease: no Stroke: no  Relevant comorbidities and cardiovascular risk factors: Obesity: no Body mass index is 27.64 kg/m.  Hypertension: no  Hyperlipidemia : Yes, on statin   Current / Home Diabetic regimen includes: Xigduo  extended release 06/998 mg 1 tablets twice a day.  Prior diabetic medications:  Glycemic data:   One Touch Verio flex glucometer data from December 22 to February 27, 2024 reviewed average blood sugar 137.  He has been mostly checking at different times of the day.  Some of the blood sugars are 127, 152, 127, 160, 150, 133, 145, 116.    Hypoglycemia: Patient has no hypoglycemic episodes. Patient has hypoglycemia awareness.  Factors modifying glucose control: 1.  Diabetic diet assessment: 3 meals a day.  2.  Staying active or exercising: Regularly walks every day about 3 miles a day.  3.  Medication compliance: compliant all of the time.  Interval history  Hemoglobin A1c was 7.1% in November.  Glucometer data as reviewed above, mostly acceptable blood sugar.  He has been  taking Xigduo .  He has no numbness and tingling of the feet.  No vision problem.  No other complaints today.  Laboratory results from November completed with primary care provider reviewed, normal urine microalbumin and creatinine ratio.  Normal electrolytes and renal function.  REVIEW OF SYSTEMS As per history of present illness.   PAST MEDICAL HISTORY: Past Medical History:  Diagnosis Date   Diabetes mellitus without complication (HCC)    Diverticulosis    2009 Virtual colonoscopy   Hx of adenomatous polyp of colon 11/2020   6 mm - no routine repeat colonoscopy needed    PAST SURGICAL HISTORY: Past Surgical History:  Procedure Laterality Date   virtual colonoscopy  2009   tics   WISDOM TOOTH EXTRACTION      ALLERGIES: No Known Allergies  FAMILY HISTORY:  Family History  Problem Relation Age of Onset   Heart disease Father    Diabetes Father    Obesity Father    Breast cancer Sister    Cancer Sister    Kidney Stones Brother    Colon cancer Neg Hx    Colon polyps Neg Hx    Esophageal cancer Neg Hx    Stomach cancer Neg Hx    Rectal cancer Neg Hx     SOCIAL  HISTORY: Social History   Socioeconomic History   Marital status: Widowed    Spouse name: Not on file   Number of children: Not on file   Years of education: Not on file   Highest education level: Bachelor's degree (e.g., BA, AB, BS)  Occupational History   Not on file  Tobacco Use   Smoking status: Never   Smokeless tobacco: Never  Vaping Use   Vaping status: Never Used  Substance and Sexual Activity   Alcohol use: Not Currently   Drug use: No   Sexual activity: Not on file  Other Topics Concern   Not on file  Social History Narrative   Not on file   Social Drivers of Health   Tobacco Use: Low Risk (02/27/2024)   Patient History    Smoking Tobacco Use: Never    Smokeless Tobacco Use: Never    Passive Exposure: Not on file  Financial Resource Strain: Low Risk (01/12/2024)   Overall Financial  Resource Strain (CARDIA)    Difficulty of Paying Living Expenses: Not hard at all  Food Insecurity: No Food Insecurity (01/12/2024)   Epic    Worried About Programme Researcher, Broadcasting/film/video in the Last Year: Never true    Ran Out of Food in the Last Year: Never true  Transportation Needs: No Transportation Needs (01/12/2024)   Epic    Lack of Transportation (Medical): No    Lack of Transportation (Non-Medical): No  Physical Activity: Sufficiently Active (01/12/2024)   Exercise Vital Sign    Days of Exercise per Week: 7 days    Minutes of Exercise per Session: 50 min  Stress: No Stress Concern Present (01/12/2024)   [employer] of Occupational Health - Occupational Stress Questionnaire    Feeling of Stress: Not at all  Social Connections: Moderately Integrated (01/12/2024)   Social Connection and Isolation Panel    Frequency of Communication with Friends and Family: More than three times a week    Frequency of Social Gatherings with Friends and Family: More than three times a week    Attends Religious Services: More than 4 times per year    Active Member of Clubs or Organizations: Patient declined    Attends Engineer, Structural: More than 4 times per year    Marital Status: Widowed  Depression (PHQ2-9): Low Risk (01/12/2024)   Depression (PHQ2-9)    PHQ-2 Score: 0  Alcohol Screen: Low Risk (01/12/2024)   Alcohol Screen    Last Alcohol Screening Score (AUDIT): 1  Housing: Low Risk (01/12/2024)   Epic    Unable to Pay for Housing in the Last Year: No    Number of Times Moved in the Last Year: 0    Homeless in the Last Year: No  Utilities: Not At Risk (01/12/2024)   Epic    Threatened with loss of utilities: No  Health Literacy: Adequate Health Literacy (01/12/2024)   B1300 Health Literacy    Frequency of need for help with medical instructions: Never    MEDICATIONS:  Current Outpatient Medications  Medication Sig Dispense Refill   ARGININE PO Take by mouth.      Barberry-Oreg Grape-Goldenseal (BERBERINE COMPLEX PO) Take by mouth.     Blood Glucose Monitoring Suppl (ONETOUCH VERIO FLEX SYSTEM) w/Device KIT Use to check blood sugar once daily  Dx code E11.9 1 kit 0   Blood Glucose Monitoring Suppl DEVI 1 each by Does not apply route in the morning, at noon, and at bedtime. May substitute to  any manufacturer covered by [employer]. 1 each 0   Cholecalciferol (VITAMIN D PO) Take by mouth.     CINNAMON PO Take by mouth.     co-enzyme Q-10 50 MG capsule Take 50 mg by mouth daily.     Glucose Blood (BLOOD GLUCOSE TEST STRIPS) STRP 1 each by In Vitro route daily. May substitute to any manufacturer covered by patient's insurance. 100 each 3   glucose blood (ONETOUCH VERIO) test strip 1 each by Other route daily. Use as instructed 100 strip 2   KRILL OIL OMEGA-3 PO Take by mouth.     Lancets Misc. MISC 1 each by Does not apply route daily. May substitute to any manufacturer covered by patient's insurance. 100 each 3   Multiple Vitamin (MULTIVITAMIN) tablet Take 1 tablet by mouth daily.     NON FORMULARY Tumeric, berberine?     NON FORMULARY 1 capsule daily at 2 am. Cholestrol complete     OneTouch Delica Lancets 33G MISC USE TO CHECK BLOOD SUGAR ONCE A DAY DX CODE E11.9 100 each 3   rosuvastatin  (CRESTOR ) 20 MG tablet TAKE 1 TABLET BY MOUTH DAILY 90 tablet 3   Dapagliflozin  Pro-metFORMIN  ER (XIGDUO  XR) 06-998 MG TB24 Take 1 tablet by mouth in the morning and at bedtime. 180 tablet 3   No current facility-administered medications for this visit.   Facility-Administered Medications Ordered in Other Visits  Medication Dose Route Frequency Provider Last Rate Last Admin   technetium tetrofosmin  (TC-MYOVIEW ) injection 32.2 millicurie  32.2 millicurie Intravenous Once PRN Croitoru, Mihai, MD        PHYSICAL EXAM: Vitals:   02/27/24 1119 02/27/24 1120  BP: (!) 142/82 132/72  Pulse: 63   Resp: 16   SpO2: 97%   Weight: 192 lb 9.6 oz (87.4 kg)   Height:  5' 10 (1.778 m)     Body mass index is 27.64 kg/m.  Wt Readings from Last 3 Encounters:  02/27/24 192 lb 9.6 oz (87.4 kg)  01/12/24 188 lb (85.3 kg)  09/28/23 189 lb 12.8 oz (86.1 kg)    General: Well developed, well nourished male in no apparent distress.  HEENT: AT/[place], no external lesions.  Eyes: Conjunctiva clear and no icterus. Neck: Neck supple  Lungs: Respirations not labored Neurologic: Alert, oriented, normal speech Extremities / Skin: Dry.  Psychiatric: Does not appear depressed or anxious  Diabetic Foot Exam - Simple   No data filed    LABS Reviewed Lab Results  Component Value Date   HGBA1C 7.1 (H) 01/10/2024   HGBA1C 6.9 (A) 09/28/2023   HGBA1C 6.7 (A) 05/12/2023   Lab Results  Component Value Date   FRUCTOSAMINE 259 03/16/2022   FRUCTOSAMINE 316 (H) 07/14/2020   Lab Results  Component Value Date   CHOL 124 01/10/2024   HDL 62 01/10/2024   LDLCALC 48 01/10/2024   TRIG 60 01/10/2024   CHOLHDL 2.0 01/10/2024   Lab Results  Component Value Date   MICRALBCREAT 2 01/10/2024   MICRALBCREAT 3 01/06/2023   Lab Results  Component Value Date   CREATININE 0.93 01/10/2024   Lab Results  Component Value Date   GFR 80.54 08/13/2022    ASSESSMENT / PLAN  1. Uncontrolled type 2 diabetes mellitus with hyperglycemia, without long-term current use of insulin (HCC)     Diabetes Mellitus type 2, complicated by no other known complications. - Diabetic status / severity: Fair control.  Lab Results  Component Value Date   HGBA1C 7.1 (H) 01/10/2024    -  Hemoglobin A1c goal : <6.5%  Discussed about limiting carbohydrates with meals.  - Medications: No change on medication.  I) continue Xigduo  extended release 06/998 mg 1 tablet 2 times a day.  - Home glucose testing: Few times a week at different times of the day.  - Discussed/ Gave Hypoglycemia treatment plan.  # Consult : not required at this time.   # Annual urine for microalbuminuria/  creatinine ratio, no microalbuminuria currently. Last  Lab Results  Component Value Date   MICRALBCREAT 2 01/10/2024    # Foot check nightly.  # Annual dilated diabetic eye exams.   - Diet: Make healthy diabetic food choices - Life style / activity / exercise: Discussed.  2. Blood pressure  -  BP Readings from Last 1 Encounters:  02/27/24 132/72    - Control is in target.  - No change in current plans.  3. Lipid status / Hyperlipidemia - Last  Lab Results  Component Value Date   LDLCALC 48 01/10/2024   - Continue rosuvastatin  20 mg daily.  Diagnoses and all orders for this visit:  Uncontrolled type 2 diabetes mellitus with hyperglycemia, without long-term current use of insulin (HCC) -     Dapagliflozin  Pro-metFORMIN  ER (XIGDUO  XR) 06-998 MG TB24; Take 1 tablet by mouth in the morning and at bedtime.     DISPOSITION Follow up in clinic in 6 months suggested.  Labs on the same day of the visit.   All questions answered and patient verbalized understanding of the plan.  Racquel Arkin, MD [facility] [address] [facility] [address] [place], [address] [place], [place] [address] Phone # [phone]  At least part of this note was generated using voice recognition software. Inadvertent word errors may have occurred, which were not recognized during the proofreading process. "

## 2024-09-03 ENCOUNTER — Ambulatory Visit: Admitting: Endocrinology

## 2025-01-22 ENCOUNTER — Other Ambulatory Visit

## 2025-01-25 ENCOUNTER — Other Ambulatory Visit

## 2025-01-29 ENCOUNTER — Encounter: Admitting: Internal Medicine
